# Patient Record
Sex: Male | Born: 1970 | Race: White | Hispanic: No | Smoking: Never smoker
Health system: Southern US, Community
[De-identification: ages and names within clinical notes are randomized; demographics above are authoritative.]

## PROBLEM LIST (undated history)

## (undated) DIAGNOSIS — K5732 Diverticulitis of large intestine without perforation or abscess without bleeding: Secondary | ICD-10-CM

## (undated) DIAGNOSIS — T4145XA Adverse effect of unspecified anesthetic, initial encounter: Secondary | ICD-10-CM

## (undated) DIAGNOSIS — T8859XA Other complications of anesthesia, initial encounter: Secondary | ICD-10-CM

## (undated) HISTORY — PX: NASAL FRACTURE SURGERY: SHX718

## (undated) HISTORY — PX: OTHER SURGICAL HISTORY: SHX169

## (undated) HISTORY — PX: IR FIBRIN GLUE REPAIR ANAL FISTULA: IMG2325

## (undated) HISTORY — PX: HERNIA REPAIR: SHX51

## (undated) HISTORY — DX: Diverticulitis of large intestine without perforation or abscess without bleeding: K57.32

---

## 2012-07-29 ENCOUNTER — Ambulatory Visit (INDEPENDENT_AMBULATORY_CARE_PROVIDER_SITE_OTHER): Payer: BC Managed Care – PPO | Admitting: Urology

## 2012-07-29 DIAGNOSIS — R109 Unspecified abdominal pain: Secondary | ICD-10-CM

## 2012-07-29 DIAGNOSIS — N410 Acute prostatitis: Secondary | ICD-10-CM

## 2017-04-08 ENCOUNTER — Other Ambulatory Visit: Payer: Self-pay | Admitting: Urology

## 2017-04-08 DIAGNOSIS — R109 Unspecified abdominal pain: Secondary | ICD-10-CM

## 2017-04-23 ENCOUNTER — Ambulatory Visit (HOSPITAL_COMMUNITY)
Admission: RE | Admit: 2017-04-23 | Discharge: 2017-04-23 | Disposition: A | Discharge status: Home / Self Care | Payer: BLUE CROSS/BLUE SHIELD | Source: Ambulatory Visit | Attending: Urology | Admitting: Urology

## 2017-04-23 DIAGNOSIS — N321 Vesicointestinal fistula: Secondary | ICD-10-CM | POA: Insufficient documentation

## 2017-04-23 DIAGNOSIS — R109 Unspecified abdominal pain: Secondary | ICD-10-CM | POA: Diagnosis present

## 2017-04-23 DIAGNOSIS — K5732 Diverticulitis of large intestine without perforation or abscess without bleeding: Secondary | ICD-10-CM | POA: Diagnosis not present

## 2017-04-23 MED ORDER — IOPAMIDOL (ISOVUE-300) INJECTION 61%
100.0000 mL | Freq: Once | INTRAVENOUS | Status: AC | PRN
  Administered 2017-04-23: 100 mL via INTRAVENOUS

## 2017-05-02 ENCOUNTER — Ambulatory Visit (INDEPENDENT_AMBULATORY_CARE_PROVIDER_SITE_OTHER): Payer: BLUE CROSS/BLUE SHIELD | Admitting: Internal Medicine

## 2017-05-02 ENCOUNTER — Encounter (INDEPENDENT_AMBULATORY_CARE_PROVIDER_SITE_OTHER): Payer: Self-pay | Admitting: Internal Medicine

## 2017-05-02 ENCOUNTER — Encounter (INDEPENDENT_AMBULATORY_CARE_PROVIDER_SITE_OTHER): Payer: Self-pay | Admitting: *Deleted

## 2017-05-02 ENCOUNTER — Encounter (INDEPENDENT_AMBULATORY_CARE_PROVIDER_SITE_OTHER): Payer: Self-pay

## 2017-05-02 ENCOUNTER — Other Ambulatory Visit (INDEPENDENT_AMBULATORY_CARE_PROVIDER_SITE_OTHER): Payer: Self-pay | Admitting: Internal Medicine

## 2017-05-02 VITALS — BP 150/70 | HR 72 | Temp 98.1°F | Ht 73.0 in | Wt 212.6 lb

## 2017-05-02 DIAGNOSIS — K5732 Diverticulitis of large intestine without perforation or abscess without bleeding: Secondary | ICD-10-CM | POA: Diagnosis not present

## 2017-05-02 HISTORY — DX: Diverticulitis of large intestine without perforation or abscess without bleeding: K57.32

## 2017-05-02 MED ORDER — METRONIDAZOLE 250 MG PO TABS: 250.0000 mg | ORAL_TABLET | Freq: Three times a day (TID) | ORAL | 0 refills | Status: DC

## 2017-05-02 MED ORDER — CIPROFLOXACIN HCL 500 MG PO TABS: 500.0000 mg | ORAL_TABLET | Freq: Two times a day (BID) | ORAL | 0 refills | Status: DC

## 2017-05-02 NOTE — Progress Notes (Signed)
   Subjective:    Patient ID: Brian Maddox, male    DOB: 11/13/1971, 46 y.o.   MRN: [id number] PCP Dr. Neita CarpSasser  HPI Referred by Dr. Joetta Mannersahistedt for sigmoid diverticulitis/colonoscopy.  Saw by Dr. Retta Dionesahlstedt 04/17/2017.  He had symptoms of acute prostatitis.  Symptoms since 03/29/2017.  This has been a recurrent problems. He has fever when he haa a "flare" The pain occurs all over his abdomen  , but more sensitive on the left lower abdomen. He also had pressure when voiding.  He says when he voided he would have gas bubbles in his urine.  He underwent a CT (see below). He says he has been battling this problems for years.  He says he feels great now. Finished the Cipro two days ago. He states he was on Cipro x 30 days. BMs x 5 a day. Some are sold and some are nothing but mucous.  Appetite is good.  He tells me he had surgery by Dr. 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  IMPRESSION: Sigmoid diverticulitis with a colovesical fistula. Suspect an associated sigmoid intussusception. A lead point mass cannot be excluded. Follow-up CT abdomen pelvis with contrast or colonoscopy is recommended after appropriate treatment, to exclude underlying malignancy. Review of Systems Past Medical History:  Diagnosis Date  . Diverticulitis of colon 05/02/2017    Past Surgical History:  Procedure Laterality Date  . HERNIA REPAIR     age 48 months  . IR FIBRIN GLUE REPAIR ANAL FISTULA     last year  . kidney reflux surgery     7318 months old  . NASAL FRACTURE SURGERY     fx nose  . three knee sugeries     two ACL/MCL repair. 3rd was scoped    No Known Allergies  No current outpatient prescriptions on file prior to visit.   No current facility-administered medications on file prior to visit.        Objective:   Physical Exam Blood pressure (!) 150/70, pulse 72, temperature 98.1 F (36.7 C), height 6\' 1"  (1.854 m),  weight 212 lb 9.6 oz (96.4 kg).  Alert and oriented. Skin warm and dry. Oral mucosa is moist.   . Sclera anicteric, conjunctivae is pink. Thyroid not enlarged. No cervical lymphadenopathy. Lungs clear. Heart regular rate and rhythm.  Abdomen is soft. Bowel sounds are positive. No hepatomegaly. No abdominal masses felt. Slight tenderness LLQ.  No edema to lower extremities.        Assessment & Plan:  Sigmoid diverticulitis with colovescial fistula. I discussed this with Dr Karilyn Cotaehman earlier this week Colonoscopy in 2-3 weeks.  Rx for Cipro and Flagyl sent to his pharmacy x 10 days

## 2017-05-02 NOTE — Telephone Encounter (Signed)
This encounter was created in error - please disregard.

## 2017-05-02 NOTE — Patient Instructions (Signed)
Continue the antibiotics Colonoscopy. The risks and benefits such as perforation, bleeding, and infection were reviewed with the patient and is agreeable.

## 2017-05-03 ENCOUNTER — Encounter (INDEPENDENT_AMBULATORY_CARE_PROVIDER_SITE_OTHER): Payer: Self-pay

## 2017-05-22 ENCOUNTER — Encounter (HOSPITAL_COMMUNITY)
Admission: RE | Disposition: A | Discharge status: Home / Self Care | Payer: Self-pay | Source: Ambulatory Visit | Attending: Internal Medicine

## 2017-05-22 ENCOUNTER — Ambulatory Visit (HOSPITAL_COMMUNITY)
Admission: RE | Admit: 2017-05-22 | Discharge: 2017-05-22 | Disposition: A | Discharge status: Home / Self Care | Payer: BLUE CROSS/BLUE SHIELD | Source: Ambulatory Visit | Attending: Internal Medicine | Admitting: Internal Medicine

## 2017-05-22 ENCOUNTER — Encounter (HOSPITAL_COMMUNITY): Payer: Self-pay | Admitting: *Deleted

## 2017-05-22 DIAGNOSIS — R1032 Left lower quadrant pain: Secondary | ICD-10-CM | POA: Diagnosis not present

## 2017-05-22 DIAGNOSIS — Z9889 Other specified postprocedural states: Secondary | ICD-10-CM | POA: Insufficient documentation

## 2017-05-22 DIAGNOSIS — K5730 Diverticulosis of large intestine without perforation or abscess without bleeding: Secondary | ICD-10-CM | POA: Diagnosis not present

## 2017-05-22 DIAGNOSIS — Z8489 Family history of other specified conditions: Secondary | ICD-10-CM | POA: Diagnosis not present

## 2017-05-22 DIAGNOSIS — K5732 Diverticulitis of large intestine without perforation or abscess without bleeding: Secondary | ICD-10-CM | POA: Diagnosis present

## 2017-05-22 DIAGNOSIS — Z807 Family history of other malignant neoplasms of lymphoid, hematopoietic and related tissues: Secondary | ICD-10-CM | POA: Diagnosis not present

## 2017-05-22 DIAGNOSIS — K6389 Other specified diseases of intestine: Secondary | ICD-10-CM | POA: Diagnosis not present

## 2017-05-22 DIAGNOSIS — K633 Ulcer of intestine: Secondary | ICD-10-CM | POA: Diagnosis not present

## 2017-05-22 DIAGNOSIS — Z8249 Family history of ischemic heart disease and other diseases of the circulatory system: Secondary | ICD-10-CM | POA: Insufficient documentation

## 2017-05-22 HISTORY — PX: COLONOSCOPY: SHX5424

## 2017-05-22 SURGERY — COLONOSCOPY
Anesthesia: Moderate Sedation

## 2017-05-22 MED ORDER — MEPERIDINE HCL 50 MG/ML IJ SOLN
INTRAMUSCULAR | Status: DC | PRN
  Administered 2017-05-22 (×2): 25 mg via INTRAVENOUS

## 2017-05-22 MED ORDER — MIDAZOLAM HCL 5 MG/5ML IJ SOLN
INTRAMUSCULAR | Status: DC | PRN
  Administered 2017-05-22: 2 mg via INTRAVENOUS
  Administered 2017-05-22: 3 mg via INTRAVENOUS
  Administered 2017-05-22: 2 mg via INTRAVENOUS
  Administered 2017-05-22: 3 mg via INTRAVENOUS

## 2017-05-22 MED ORDER — STERILE WATER FOR IRRIGATION IR SOLN
Status: DC | PRN
  Administered 2017-05-22: 4 mL

## 2017-05-22 MED ORDER — MIDAZOLAM HCL 5 MG/5ML IJ SOLN
INTRAMUSCULAR | Status: AC
  Filled 2017-05-22: qty 5

## 2017-05-22 MED ORDER — MEPERIDINE HCL 50 MG/ML IJ SOLN
INTRAMUSCULAR | Status: AC
  Filled 2017-05-22: qty 1

## 2017-05-22 MED ORDER — MIDAZOLAM HCL 5 MG/5ML IJ SOLN
INTRAMUSCULAR | Status: AC
  Filled 2017-05-22: qty 10

## 2017-05-22 MED ORDER — SODIUM CHLORIDE 0.9 % IV SOLN
INTRAVENOUS | Status: DC
  Administered 2017-05-22: 1000 mL via INTRAVENOUS

## 2017-05-22 MED ORDER — METRONIDAZOLE 250 MG PO TABS: 250.0000 mg | ORAL_TABLET | Freq: Three times a day (TID) | ORAL | 0 refills | Status: DC

## 2017-05-22 MED ORDER — CIPROFLOXACIN HCL 500 MG PO TABS: 500.0000 mg | ORAL_TABLET | Freq: Two times a day (BID) | ORAL | 0 refills | Status: DC

## 2017-05-22 NOTE — Op Note (Signed)
[facility] Patient Name: Brian Maddox Procedure Date: 05/22/2017 3:41 PM MRN: [id number] Date of Birth: 06/29/1971 Attending MD: Lionel December , MD CSN: [id number] Age: 46 Admit Type: Outpatient Procedure:                Colonoscopy Indications:              Suspected diverticulitis Providers:                Lionel December, MD, Jannett Celestine, RN, [place] Scrape RN, RN Referring MD:             Bertram Millard. Dahlstedt, MD and Estanislado Pandy, MD Medicines:                Meperidine 50 mg IV, Midazolam 10 mg IV Complications:            No immediate complications. Estimated Blood Loss:     Estimated blood loss: none. Procedure:                Pre-Anesthesia Assessment:                           - Prior to the procedure, a History and Physical                            was performed, and patient medications and                            allergies were reviewed. The patient's tolerance of                            previous anesthesia was also reviewed. The risks                            and benefits of the procedure and the sedation                            options and risks were discussed with the patient.                            All questions were answered, and informed consent                            was obtained. Prior Anticoagulants: The patient                            last took previous NSAID medication 7 days prior to                            the procedure. ASA Grade Assessment: I - A normal,                            healthy patient. After reviewing the risks and  benefits, the patient was deemed in satisfactory                            condition to undergo the procedure.                           After obtaining informed consent, the colonoscope                            was passed under direct vision. Throughout the                            procedure, the patient's blood pressure, pulse, and                           oxygen saturations were monitored continuously. The                            EC-[id number] ([id number] scope was introduced through                            the anus and advanced to the the terminal ileum,                            with identification of the appendiceal orifice and                            IC valve. The colonoscopy was performed without                            difficulty. The patient tolerated the procedure                            well. The quality of the bowel preparation was                            adequate. The terminal ileum, ileocecal valve,                            appendiceal orifice, and rectum were photographed. Scope In: 4:11:52 PM Scope Out: 4:29:01 PM Scope Withdrawal Time: 0 hours 11 minutes 9 seconds  Total Procedure Duration: 0 hours 17 minutes 9 seconds  Findings:      The perianal and digital rectal examinations were normal.      The terminal ileum appeared normal.      The descending colon, splenic flexure, transverse colon, hepatic       flexure, ascending colon, cecum, appendiceal orifice and ileocecal valve       appeared normal.      Scattered small-mouthed diverticula were found in the sigmoid colon.      Exudate was found in the sigmoid colon.      Ulcerated mucosa at mid sigmoid colon covered with exudate as well as       submucosal hemorrhages.      The retroflexed view of the distal rectum and anal verge was normal and  showed no anal or rectal abnormalities. Impression:               - The examined portion of the ileum was normal.                           - The descending colon, splenic flexure, transverse                            colon, hepatic flexure, ascending colon, cecum,                            appendiceal orifice and ileocecal valve are normal.                           - Diverticulosis in the sigmoid colon.                           - Endoscopic findings consistent with sigmoid                             diverticulitis.                           - No specimens collected.                           Comment: Diverticulitis not resolved with                            antibiotics. Patient will need surgical                            intervention. Moderate Sedation:      Moderate (conscious) sedation was administered by the endoscopy nurse       and supervised by the endoscopist. The following parameters were       monitored: oxygen saturation, heart rate, blood pressure, CO2       capnography and response to care. Total physician intraservice time was       25 minutes. Recommendation:           - Patient has a contact number available for                            emergencies. The signs and symptoms of potential                            delayed complications were discussed with the                            patient. Return to normal activities tomorrow.                            Written discharge instructions were provided to the                            patient.                           -  Resume previous diet today.                           - patient will resume Cipro at 500 mg by mouth                            twice a day and metronidazole 250 mg by mouth 3                            times a day.                           -Will arrange for follow-up with Dr. Retta Diones.                           - No aspirin, ibuprofen, naproxen, or other                            non-steroidal anti-inflammatory drugs.                           - Repeat colonoscopy in 10 years for screening                            purposes. Procedure Code(s):        --- Professional ---                           [phone], Colonoscopy, flexible; diagnostic, including                            collection of specimen(s) by brushing or washing,                            when performed (separate procedure)                           99152, Moderate sedation services provided by the                             same physician or other qualified health care                            professional performing the diagnostic or                            therapeutic service that the sedation supports,                            requiring the presence of an independent trained                            observer to assist in the monitoring of the  patient's level of consciousness and physiological                            status; initial 15 minutes of intraservice time,                            patient age 47 years or older                           [phone], Moderate sedation services; each additional                            15 minutes intraservice time Diagnosis Code(s):        --- Professional ---                           K63.89, Other specified diseases of intestine                           K63.3, Ulcer of intestine                           K57.30, Diverticulosis of large intestine without                            perforation or abscess without bleeding CPT copyright 2016 American Medical Association. All rights reserved. The codes documented in this report are preliminary and upon coder review may  be revised to meet current compliance requirements. Lionel December, MD Lionel December, MD 05/22/2017 4:47:25 PM This report has been signed electronically. Number of Addenda: 0

## 2017-05-22 NOTE — Discharge Instructions (Signed)
Resume Cipro and metronidazole as before. Prescription sent to pharmacy for 2 more weeks. Resume usual diet. Follow-up with Dr. Retta Dionesahlstedt. I will contact his office and arrange for visit.      Colonoscopy, Adult, Care After This sheet gives you information about how to care for yourself after your procedure. Your doctor may also give you more specific instructions. If you have problems or questions, call your doctor. Follow these instructions at home: General instructions   For the first 24 hours after the procedure: ? Do not drive or use machinery. ? Do not sign important documents. ? Do not drink alcohol. ? Do your daily activities more slowly than normal. ? Eat foods that are soft and easy to digest. ? Rest often.  Take over-the-counter or prescription medicines only as told by your doctor.  It is up to you to get the results of your procedure. Ask your doctor, or the department performing the procedure, when your results will be ready. To help cramping and bloating:  Try walking around.  Put heat on your belly (abdomen) as told by your doctor. Use a heat source that your doctor recommends, such as a moist heat pack or a heating pad. ? Put a towel between your skin and the heat source. ? Leave the heat on for 20-30 minutes. ? Remove the heat if your skin turns bright red. This is especially important if you cannot feel pain, heat, or cold. You can get burned. Eating and drinking  Drink enough fluid to keep your pee (urine) clear or pale yellow.  Return to your normal diet as told by your doctor. Avoid heavy or fried foods that are hard to digest.  Avoid drinking alcohol for as long as told by your doctor. Contact a doctor if:  You have blood in your poop (stool) 2-3 days after the procedure. Get help right away if:  You have more than a small amount of blood in your poop.  You see large clumps of tissue (blood clots) in your poop.  Your belly is swollen.  You  feel sick to your stomach (nauseous).  You throw up (vomit).  You have a fever.  You have belly pain that gets worse, and medicine does not help your pain. This information is not intended to replace advice given to you by your health care provider. Make sure you discuss any questions you have with your health care provider. Document Released: 12/29/2010 Document Revised: 08/20/2016 Document Reviewed: 08/20/2016 Elsevier Interactive Patient Education  2017 Elsevier Inc.    Diverticulitis Diverticulitis is when small pockets in your large intestine (colon) get infected or swollen. This causes stomach pain and watery poop (diarrhea). These pouches are called diverticula. They form in people who have a condition called diverticulosis. Follow these instructions at home: Medicines  Take over-the-counter and prescription medicines only as told by your doctor. These include: ? Antibiotics. ? Pain medicines. ? Fiber pills. ? Probiotics. ? Stool softeners.  Do not drive or use heavy machinery while taking prescription pain medicine.  If you were prescribed an antibiotic, take it as told. Do not stop taking it even if you feel better. General instructions  Follow a diet as told by your doctor.  When you feel better, your doctor may tell you to change your diet. You may need to eat a lot of fiber. Fiber makes it easier to poop (have bowel movements). Healthy foods with fiber include: ? Berries. ? Beans. ? Lentils. ? Green vegetables.  Exercise  3 or more times a week. Aim for 30 minutes each time. Exercise enough to sweat and make your heart beat faster.  Keep all follow-up visits as told. This is important. You may need to have an exam of the large intestine. This is called a colonoscopy. Contact a doctor if:  Your pain does not get better.  You have a hard time eating or drinking.  You are not pooping like normal. Get help right away if:  Your pain gets worse.  Your  problems do not get better.  Your problems get worse very fast.  You have a fever.  You throw up (vomit) more than one time.  You have poop that is: ? Bloody. ? Black. ? Tarry. Summary  Diverticulitis is when small pockets in your large intestine (colon) get infected or swollen.  Take medicines only as told by your doctor.  Follow a diet as told by your doctor. This information is not intended to replace advice given to you by your health care provider. Make sure you discuss any questions you have with your health care provider. Document Released: 05/14/2008 Document Revised: 12/13/2016 Document Reviewed: 12/13/2016 Elsevier Interactive Patient Education  2017 [employer].

## 2017-05-22 NOTE — H&P (Signed)
Brian Maddox is an 46 y.o. male.   Chief Complaint: Patient is here for colonoscopy. HPI: Patient is 46 year old Caucasian male who was recently diagnosed with diverticulitis complicated by colovesical fistula. he's been treated with antibiotics and not passing air when he urinates. He still has mild discomfort at LLQ. He has sporadic hematochezia in the form of blood on the tissue. She has good appetite and he has not lost any weight. He takes ibuprofen up to 800 mg once or twice a week on as-needed basis. Family studies negative for CRC or IBD but father has been treated for diverticulitis.   Past Medical History:  Diagnosis Date  . Diverticulitis of colon 05/02/2017    Past Surgical History:  Procedure Laterality Date  . HERNIA REPAIR     age 48 months  . IR FIBRIN GLUE REPAIR ANAL FISTULA     last year  . kidney reflux surgery     118 months old  . NASAL FRACTURE SURGERY     fx nose  . three knee sugeries     two ACL/MCL repair. 3rd was scoped    Family History  Problem Relation Age of Onset  . Non-Hodgkin's lymphoma Mother   . Sarcoidosis Father   . Coronary artery disease Father    Social History:  reports that he has never smoked. He has never used smokeless tobacco. He reports that he drinks alcohol. He reports that he does not use drugs.  Allergies: No Known Allergies  Medications Prior to Admission  Medication Sig Dispense Refill  . ciprofloxacin (CIPRO) 500 MG tablet Take 1 tablet (500 mg total) by mouth 2 (two) times daily. (Patient not taking: Reported on 05/20/2017) 28 tablet 0  . metroNIDAZOLE (FLAGYL) 250 MG tablet Take 1 tablet (250 mg total) by mouth 3 (three) times daily. (Patient not taking: Reported on 05/20/2017) 30 tablet 0    No results found for this or any previous visit (from the past 48 hour(s)). No results found.  ROS  Blood pressure 124/73, pulse 76, temperature 97.7 F (36.5 C), temperature source Oral, resp. rate 13, height 6\' 1"  (1.854  m), weight 205 lb (93 kg), SpO2 100 %. Physical Exam  Constitutional: He appears well-developed and well-nourished.  HENT:  Mouth/Throat: Oropharynx is clear and moist.  Eyes: Conjunctivae are normal. No scleral icterus.  Neck: No thyromegaly present.  Cardiovascular: Normal rate, regular rhythm and normal heart sounds.   No murmur heard. Respiratory: Effort normal and breath sounds normal.  GI:  Abdomen is symmetrical and soft with mild tenderness at LLQ. No organomegaly or masses.  Musculoskeletal: He exhibits no edema.  Lymphadenopathy:    He has no cervical adenopathy.  Neurological: He is alert.  Skin: Skin is warm and dry.     Assessment/Plan History of diverticulitis with caudal was acting fistula. Fistula has possibly healed. Diagnostic colonoscopy.  Brian DecemberNajeeb Britt Theard, MD 05/22/2017, 3:55 PM

## 2017-05-27 ENCOUNTER — Encounter (HOSPITAL_COMMUNITY): Payer: Self-pay | Admitting: Internal Medicine

## 2017-05-28 ENCOUNTER — Telehealth (INDEPENDENT_AMBULATORY_CARE_PROVIDER_SITE_OTHER): Payer: Self-pay | Admitting: Internal Medicine

## 2017-05-28 NOTE — Telephone Encounter (Signed)
Patient called, he would like to know if Dr. Karilyn Cotaehman has contacted Dr. Retta Dionesahlstedt.  He wants to find out where we are going from here regarding his case.  [phone]

## 2017-05-28 NOTE — Telephone Encounter (Signed)
Forwarded to Dr.Rehman for review. 

## 2017-05-29 NOTE — Telephone Encounter (Signed)
Dr.Rehman has talked with Dr.Dahlstedt and he, Dr.Dahlstedt is going to make arrangements for patient's surgery. A message has been left on the patient's voicemail.

## 2017-06-20 ENCOUNTER — Ambulatory Visit (HOSPITAL_COMMUNITY): Payer: Self-pay | Admitting: Surgery

## 2017-07-26 NOTE — Patient Instructions (Addendum)
Brian Maddox  07/26/2017   Your procedure is scheduled on: 08-07-17   Report to [facility] Main  Entrance Take [place] Elevators to 3rd floor to  Short Stay Center at 8:00 AM.   Call this number if you have problems the morning of surgery [phone]    Remember: ONLY 1 PERSON MAY GO WITH YOU TO SHORT STAY TO GET  READY MORNING OF YOUR SURGERY.  Please consume a Clear Liquid Diet on the day of prep to prevent dehydration. Do not eat food or drink liquids :After Midnight.     CLEAR LIQUID DIET   Foods Allowed                                                                     Foods Excluded  Coffee and tea, regular and decaf                             liquids that you cannot  Plain Jell-O in any flavor                                             see through such as: Fruit ices (not with fruit pulp)                                     milk, soups, orange juice  Iced Popsicles                                    All solid food Carbonated beverages, regular and diet                                    Cranberry, grape and apple juices Sports drinks like Gatorade Lightly seasoned clear broth or consume(fat free) Sugar, honey syrup  Sample Menu Breakfast                                Lunch                                     Supper Cranberry juice                    Beef broth                            Chicken broth Jell-O                                     Grape juice  Apple juice Coffee or tea                        Jell-O                                      Popsicle                                                Coffee or tea                        Coffee or tea  _____________________________________________________________________     Take these medicines the morning of surgery with A SIP OF WATER: None                                You may not have any metal on your body including hair pins and              piercings   Do not wear jewelry, make-up, lotions, powders or perfumes, deodorant             Men may shave face and neck.   Do not bring valuables to the hospital. [facility] IS NOT             RESPONSIBLE   FOR VALUABLES.  Contacts, dentures or bridgework may not be worn into surgery.  Leave suitcase in the car. After surgery it may be brought to your room.                 Please read over the following fact sheets you were given: _____________________________________________________________________             [facility] - Preparing for Surgery Before surgery, you can play an important role.  Because skin is not sterile, your skin needs to be as free of germs as possible.  You can reduce the number of germs on your skin by washing with CHG (chlorahexidine gluconate) soap before surgery.  CHG is an antiseptic cleaner which kills germs and bonds with the skin to continue killing germs even after washing. Please DO NOT use if you have an allergy to CHG or antibacterial soaps.  If your skin becomes reddened/irritated stop using the CHG and inform your nurse when you arrive at Short Stay. Do not shave (including legs and underarms) for at least 48 hours prior to the first CHG shower.  You may shave your face/neck. Please follow these instructions carefully:  1.  Shower with CHG Soap the night before surgery and the  morning of Surgery.  2.  If you choose to wash your hair, wash your hair first as usual with your  normal  shampoo.  3.  After you shampoo, rinse your hair and body thoroughly to remove the  shampoo.                           4.  Use CHG as you would any other liquid soap.  You can apply chg directly  to the skin and wash  Gently with a scrungie or clean washcloth.  5.  Apply the CHG Soap to your body ONLY FROM THE NECK DOWN.   Do not use on face/ open                           Wound or open sores. Avoid contact with eyes, ears mouth and genitals (private parts).                        Wash face,  Genitals (private parts) with your normal soap.             6.  Wash thoroughly, paying special attention to the area where your surgery  will be performed.  7.  Thoroughly rinse your body with warm water from the neck down.  8.  DO NOT shower/wash with your normal soap after using and rinsing off  the CHG Soap.                9.  Pat yourself dry with a clean towel.            10.  Wear clean pajamas.            11.  Place clean sheets on your bed the night of your first shower and do not  sleep with pets. Day of Surgery : Do not apply any lotions/deodorants the morning of surgery.  Please wear clean clothes to the hospital/surgery center.  FAILURE TO FOLLOW THESE INSTRUCTIONS MAY RESULT IN THE CANCELLATION OF YOUR SURGERY PATIENT SIGNATURE_________________________________  NURSE SIGNATURE__________________________________  ________________________________________________________________________

## 2017-07-31 ENCOUNTER — Encounter (HOSPITAL_COMMUNITY): Payer: Self-pay

## 2017-07-31 ENCOUNTER — Encounter (HOSPITAL_COMMUNITY)
Admission: RE | Admit: 2017-07-31 | Discharge: 2017-07-31 | Disposition: A | Discharge status: Home / Self Care | Payer: BLUE CROSS/BLUE SHIELD | Source: Ambulatory Visit | Attending: Surgery | Admitting: Surgery

## 2017-07-31 ENCOUNTER — Encounter (INDEPENDENT_AMBULATORY_CARE_PROVIDER_SITE_OTHER): Payer: Self-pay

## 2017-07-31 DIAGNOSIS — K5732 Diverticulitis of large intestine without perforation or abscess without bleeding: Secondary | ICD-10-CM | POA: Insufficient documentation

## 2017-07-31 DIAGNOSIS — K632 Fistula of intestine: Secondary | ICD-10-CM | POA: Insufficient documentation

## 2017-07-31 DIAGNOSIS — Z01812 Encounter for preprocedural laboratory examination: Secondary | ICD-10-CM | POA: Diagnosis present

## 2017-07-31 HISTORY — DX: Other complications of anesthesia, initial encounter: T88.59XA

## 2017-07-31 HISTORY — DX: Adverse effect of unspecified anesthetic, initial encounter: T41.45XA

## 2017-07-31 LAB — CBC
HCT: 43.6 % (ref 39.0–52.0)
HEMOGLOBIN: 14.4 g/dL (ref 13.0–17.0)
MCH: 27.9 pg (ref 26.0–34.0)
MCHC: 33 g/dL (ref 30.0–36.0)
MCV: 84.3 fL (ref 78.0–100.0)
Platelets: 235 10*3/uL (ref 150–400)
RBC: 5.17 MIL/uL (ref 4.22–5.81)
RDW: 14.4 % (ref 11.5–15.5)
WBC: 11.5 10*3/uL — ABNORMAL HIGH (ref 4.0–10.5)

## 2017-07-31 LAB — HEMOGLOBIN A1C
Hgb A1c MFr Bld: 5.8 % — ABNORMAL HIGH (ref 4.8–5.6)
Mean Plasma Glucose: 119.76 mg/dL

## 2017-07-31 LAB — ABO/RH: ABO/RH(D): O POS

## 2017-07-31 NOTE — Consult Note (Signed)
WOC Nurse requested for preoperative stoma site marking  Discussed surgical procedure and stoma creation with patient. Explained role of the WOC nurse team.  Provided the patient with educational booklet and provided samples of pouching options.  Answered patient questions. He is very much hoping to not have a stoma. We discussed the rationale for marking "just in case" so that it would in a good location for him to manage.  Examined patient sitting, and standing in order to place the marking in the patient's visual field, away from any creases or abdominal contour issues and within the rectus muscle.  Not able to mark below the patient's belt line due to the fact he wears his pants low on his abdomen.  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    Patient's abdomen cleansed with CHG wipes at site markings, allowed to air dry prior to marking.Covered mark with thin film transparent dressing to preserve mark until date of surgery. Requested that the patient reapply marking if it becomes light or the thin film comes off. He is a [employer] and is outside a lot, concerned that with diaphoresis the thin film may detach. He has the surgical marking pen with him.   WOC Nurse team will follow up with patient after surgery for continue ostomy care and teaching if needed.  Numa Heatwole [facility] MSN, RN,CWOCN, CNS, [employer] [phone]

## 2017-08-06 MED ORDER — SODIUM CHLORIDE 0.9 % IV SOLN
INTRAVENOUS | Status: DC
  Filled 2017-08-06: qty 6

## 2017-08-07 ENCOUNTER — Encounter (HOSPITAL_COMMUNITY): Payer: Self-pay | Admitting: *Deleted

## 2017-08-07 ENCOUNTER — Encounter (HOSPITAL_COMMUNITY)
Admission: RE | Disposition: A | Discharge status: Home / Self Care | Payer: Self-pay | Source: Ambulatory Visit | Attending: Surgery

## 2017-08-07 ENCOUNTER — Inpatient Hospital Stay (HOSPITAL_COMMUNITY)
Admission: RE | Admit: 2017-08-07 | Discharge: 2017-08-09 | DRG: 330 | Disposition: A | Discharge status: Home / Self Care | Payer: BLUE CROSS/BLUE SHIELD | Source: Ambulatory Visit | Attending: Surgery | Admitting: Surgery

## 2017-08-07 ENCOUNTER — Inpatient Hospital Stay (HOSPITAL_COMMUNITY): Payer: BLUE CROSS/BLUE SHIELD | Admitting: Anesthesiology

## 2017-08-07 DIAGNOSIS — K5732 Diverticulitis of large intestine without perforation or abscess without bleeding: Secondary | ICD-10-CM | POA: Diagnosis present

## 2017-08-07 DIAGNOSIS — N321 Vesicointestinal fistula: Secondary | ICD-10-CM

## 2017-08-07 DIAGNOSIS — N400 Enlarged prostate without lower urinary tract symptoms: Secondary | ICD-10-CM | POA: Diagnosis present

## 2017-08-07 DIAGNOSIS — K632 Fistula of intestine: Secondary | ICD-10-CM | POA: Diagnosis present

## 2017-08-07 HISTORY — PX: PROCTOSCOPY: SHX2266

## 2017-08-07 LAB — TYPE AND SCREEN
ABO/RH(D): O POS
Antibody Screen: NEGATIVE

## 2017-08-07 SURGERY — COLECTOMY, PARTIAL, ROBOT-ASSISTED, LAPAROSCOPIC
Anesthesia: General

## 2017-08-07 MED ORDER — SUGAMMADEX SODIUM 200 MG/2ML IV SOLN
INTRAVENOUS | Status: DC | PRN
  Administered 2017-08-07: 200 mg via INTRAVENOUS

## 2017-08-07 MED ORDER — SODIUM CHLORIDE 0.9 % IR SOLN
Status: DC | PRN
  Administered 2017-08-07: 1000 mL via INTRAVESICAL

## 2017-08-07 MED ORDER — 0.9 % SODIUM CHLORIDE (POUR BTL) OPTIME
TOPICAL | Status: DC | PRN
  Administered 2017-08-07: 2000 mL

## 2017-08-07 MED ORDER — ACETAMINOPHEN 500 MG PO TABS
1000.0000 mg | ORAL_TABLET | ORAL | Status: AC
  Administered 2017-08-07: 1000 mg via ORAL
  Filled 2017-08-07: qty 2

## 2017-08-07 MED ORDER — METRONIDAZOLE 500 MG PO TABS: 1000.0000 mg | ORAL_TABLET | ORAL | Status: DC

## 2017-08-07 MED ORDER — HYDROCORTISONE 2.5 % RE CREA
1.0000 "application " | TOPICAL_CREAM | Freq: Four times a day (QID) | RECTAL | Status: DC | PRN
  Filled 2017-08-07 (×2): qty 28.35

## 2017-08-07 MED ORDER — FENTANYL CITRATE (PF) 250 MCG/5ML IJ SOLN
INTRAMUSCULAR | Status: AC
  Filled 2017-08-07: qty 5

## 2017-08-07 MED ORDER — ALUM & MAG HYDROXIDE-SIMETH 200-200-20 MG/5ML PO SUSP: 30.0000 mL | Freq: Four times a day (QID) | ORAL | Status: DC | PRN

## 2017-08-07 MED ORDER — HYDROMORPHONE HCL-NACL 0.5-0.9 MG/ML-% IV SOSY
0.5000 mg | PREFILLED_SYRINGE | INTRAVENOUS | Status: DC | PRN
  Administered 2017-08-07 – 2017-08-08 (×2): 1 mg via INTRAVENOUS
  Administered 2017-08-08: 2 mg via INTRAVENOUS
  Administered 2017-08-08: 1 mg via INTRAVENOUS
  Administered 2017-08-08: 2 mg via INTRAVENOUS
  Filled 2017-08-07 (×2): qty 2
  Filled 2017-08-07 (×2): qty 4
  Filled 2017-08-07: qty 2

## 2017-08-07 MED ORDER — MAGIC MOUTHWASH
15.0000 mL | Freq: Four times a day (QID) | ORAL | Status: DC | PRN
  Filled 2017-08-07: qty 15

## 2017-08-07 MED ORDER — DIPHENHYDRAMINE HCL 50 MG/ML IJ SOLN: 12.5000 mg | Freq: Four times a day (QID) | INTRAMUSCULAR | Status: DC | PRN

## 2017-08-07 MED ORDER — ROCURONIUM BROMIDE 50 MG/5ML IV SOSY
PREFILLED_SYRINGE | INTRAVENOUS | Status: AC
  Filled 2017-08-07: qty 5

## 2017-08-07 MED ORDER — FENTANYL CITRATE (PF) 100 MCG/2ML IJ SOLN
INTRAMUSCULAR | Status: DC | PRN
Start: 2017-08-07 — End: 2017-08-07
  Administered 2017-08-07 (×3): 50 ug via INTRAVENOUS
  Administered 2017-08-07: 100 ug via INTRAVENOUS

## 2017-08-07 MED ORDER — BUPIVACAINE-EPINEPHRINE (PF) 0.25% -1:200000 IJ SOLN
INTRAMUSCULAR | Status: DC | PRN
  Administered 2017-08-07: 50 mL

## 2017-08-07 MED ORDER — PHENOL 1.4 % MT LIQD: 1.0000 | OROMUCOSAL | Status: DC | PRN

## 2017-08-07 MED ORDER — OXYCODONE HCL 5 MG/5ML PO SOLN: 5.0000 mg | Freq: Once | ORAL | Status: DC | PRN

## 2017-08-07 MED ORDER — POLYETHYLENE GLYCOL 3350 17 GM/SCOOP PO POWD: 1.0000 | Freq: Once | ORAL | Status: DC

## 2017-08-07 MED ORDER — FENTANYL CITRATE (PF) 100 MCG/2ML IJ SOLN
INTRAMUSCULAR | Status: AC
  Filled 2017-08-07: qty 2

## 2017-08-07 MED ORDER — DEXAMETHASONE SODIUM PHOSPHATE 10 MG/ML IJ SOLN
INTRAMUSCULAR | Status: AC
  Filled 2017-08-07: qty 1

## 2017-08-07 MED ORDER — BUPIVACAINE LIPOSOME 1.3 % IJ SUSP
INTRAMUSCULAR | Status: DC | PRN
  Administered 2017-08-07: 20 mL

## 2017-08-07 MED ORDER — NEOMYCIN SULFATE 500 MG PO TABS: 1000.0000 mg | ORAL_TABLET | ORAL | Status: DC

## 2017-08-07 MED ORDER — ALVIMOPAN 12 MG PO CAPS
12.0000 mg | ORAL_CAPSULE | Freq: Once | ORAL | Status: AC
  Administered 2017-08-07: 12 mg via ORAL
  Filled 2017-08-07: qty 1

## 2017-08-07 MED ORDER — SODIUM CHLORIDE 0.9 % IV SOLN
INTRAVENOUS | Status: DC | PRN
  Administered 2017-08-07: 1000 mL

## 2017-08-07 MED ORDER — ACETAMINOPHEN 500 MG PO TABS
1000.0000 mg | ORAL_TABLET | Freq: Three times a day (TID) | ORAL | Status: DC
  Administered 2017-08-07 – 2017-08-09 (×6): 1000 mg via ORAL
  Filled 2017-08-07 (×6): qty 2

## 2017-08-07 MED ORDER — METHYLENE BLUE 0.5 % INJ SOLN
INTRAVENOUS | Status: AC
  Filled 2017-08-07: qty 10

## 2017-08-07 MED ORDER — ENOXAPARIN SODIUM 40 MG/0.4ML ~~LOC~~ SOLN
40.0000 mg | Freq: Once | SUBCUTANEOUS | Status: AC
  Administered 2017-08-07: 40 mg via SUBCUTANEOUS
  Filled 2017-08-07: qty 0.4

## 2017-08-07 MED ORDER — TRAMADOL HCL 50 MG PO TABS: 50.0000 mg | ORAL_TABLET | Freq: Four times a day (QID) | ORAL | 0 refills | Status: DC | PRN

## 2017-08-07 MED ORDER — BUPIVACAINE-EPINEPHRINE (PF) 0.25% -1:200000 IJ SOLN
INTRAMUSCULAR | Status: AC
  Filled 2017-08-07: qty 60

## 2017-08-07 MED ORDER — LIDOCAINE 2% (20 MG/ML) 5 ML SYRINGE
INTRAMUSCULAR | Status: AC
  Filled 2017-08-07: qty 5

## 2017-08-07 MED ORDER — ENOXAPARIN SODIUM 40 MG/0.4ML ~~LOC~~ SOLN
40.0000 mg | SUBCUTANEOUS | Status: DC
  Administered 2017-08-08 – 2017-08-09 (×2): 40 mg via SUBCUTANEOUS
  Filled 2017-08-07 (×2): qty 0.4

## 2017-08-07 MED ORDER — ONDANSETRON HCL 4 MG/2ML IJ SOLN: 4.0000 mg | Freq: Four times a day (QID) | INTRAMUSCULAR | Status: DC | PRN

## 2017-08-07 MED ORDER — ONDANSETRON HCL 4 MG/2ML IJ SOLN
INTRAMUSCULAR | Status: DC | PRN
  Administered 2017-08-07: 4 mg via INTRAVENOUS

## 2017-08-07 MED ORDER — DEXAMETHASONE SODIUM PHOSPHATE 10 MG/ML IJ SOLN
INTRAMUSCULAR | Status: DC | PRN
  Administered 2017-08-07: 10 mg via INTRAVENOUS

## 2017-08-07 MED ORDER — GABAPENTIN 300 MG PO CAPS
300.0000 mg | ORAL_CAPSULE | Freq: Two times a day (BID) | ORAL | Status: DC
  Administered 2017-08-07 – 2017-08-09 (×5): 300 mg via ORAL
  Filled 2017-08-07 (×5): qty 1

## 2017-08-07 MED ORDER — LIDOCAINE 2% (20 MG/ML) 5 ML SYRINGE
INTRAMUSCULAR | Status: DC | PRN
  Administered 2017-08-07: 1.5 mg/kg/h via INTRAVENOUS

## 2017-08-07 MED ORDER — GABAPENTIN 300 MG PO CAPS
300.0000 mg | ORAL_CAPSULE | ORAL | Status: AC
  Administered 2017-08-07: 300 mg via ORAL
  Filled 2017-08-07: qty 1

## 2017-08-07 MED ORDER — LACTATED RINGERS IV SOLN
INTRAVENOUS | Status: DC
  Administered 2017-08-07 (×2): via INTRAVENOUS

## 2017-08-07 MED ORDER — ONDANSETRON HCL 4 MG/2ML IJ SOLN
INTRAMUSCULAR | Status: AC
  Filled 2017-08-07: qty 2

## 2017-08-07 MED ORDER — ROCURONIUM BROMIDE 100 MG/10ML IV SOLN
INTRAVENOUS | Status: DC | PRN
  Administered 2017-08-07: 50 mg via INTRAVENOUS
  Administered 2017-08-07 (×4): 20 mg via INTRAVENOUS

## 2017-08-07 MED ORDER — BISACODYL 5 MG PO TBEC: 20.0000 mg | DELAYED_RELEASE_TABLET | Freq: Once | ORAL | Status: DC

## 2017-08-07 MED ORDER — DIPHENHYDRAMINE HCL 12.5 MG/5ML PO ELIX: 12.5000 mg | ORAL_SOLUTION | Freq: Four times a day (QID) | ORAL | Status: DC | PRN

## 2017-08-07 MED ORDER — MENTHOL 3 MG MT LOZG: 1.0000 | LOZENGE | OROMUCOSAL | Status: DC | PRN

## 2017-08-07 MED ORDER — MEPERIDINE HCL 50 MG/ML IJ SOLN
12.5000 mg | Freq: Once | INTRAMUSCULAR | Status: AC
  Administered 2017-08-07: 12.5 mg via INTRAVENOUS

## 2017-08-07 MED ORDER — ZOLPIDEM TARTRATE 5 MG PO TABS: 5.0000 mg | ORAL_TABLET | Freq: Every evening | ORAL | Status: DC | PRN

## 2017-08-07 MED ORDER — DEXTROSE 5 % IV SOLN
2.0000 g | Freq: Two times a day (BID) | INTRAVENOUS | Status: AC
  Administered 2017-08-07: 2 g via INTRAVENOUS
  Filled 2017-08-07: qty 2

## 2017-08-07 MED ORDER — LIDOCAINE 2% (20 MG/ML) 5 ML SYRINGE
INTRAMUSCULAR | Status: AC
  Filled 2017-08-07: qty 10

## 2017-08-07 MED ORDER — CELECOXIB 200 MG PO CAPS
400.0000 mg | ORAL_CAPSULE | ORAL | Status: AC
  Administered 2017-08-07: 400 mg via ORAL
  Filled 2017-08-07: qty 2

## 2017-08-07 MED ORDER — MIDAZOLAM HCL 2 MG/2ML IJ SOLN
INTRAMUSCULAR | Status: AC
  Filled 2017-08-07: qty 2

## 2017-08-07 MED ORDER — LACTATED RINGERS IV SOLN
1000.0000 mL | Freq: Three times a day (TID) | INTRAVENOUS | Status: DC | PRN
Start: 2017-08-07 — End: 2017-08-09

## 2017-08-07 MED ORDER — ROCURONIUM BROMIDE 50 MG/5ML IV SOSY
PREFILLED_SYRINGE | INTRAVENOUS | Status: AC
Start: 2017-08-07 — End: 2017-08-07
  Filled 2017-08-07: qty 5

## 2017-08-07 MED ORDER — PROCHLORPERAZINE EDISYLATE 5 MG/ML IJ SOLN: 5.0000 mg | INTRAMUSCULAR | Status: DC | PRN

## 2017-08-07 MED ORDER — SODIUM CHLORIDE 0.9 % IV SOLN
INTRAVENOUS | Status: DC
  Administered 2017-08-07: 20:00:00 via INTRAVENOUS

## 2017-08-07 MED ORDER — LIP MEDEX EX OINT
1.0000 "application " | TOPICAL_OINTMENT | Freq: Two times a day (BID) | CUTANEOUS | Status: DC
  Administered 2017-08-07 – 2017-08-09 (×5): 1 via TOPICAL
  Filled 2017-08-07 (×2): qty 7

## 2017-08-07 MED ORDER — METHYLENE BLUE 0.5 % INJ SOLN
INTRAVENOUS | Status: DC | PRN
  Administered 2017-08-07: 3 mL

## 2017-08-07 MED ORDER — OXYCODONE HCL 5 MG PO TABS: 5.0000 mg | ORAL_TABLET | Freq: Once | ORAL | Status: DC | PRN

## 2017-08-07 MED ORDER — SUGAMMADEX SODIUM 200 MG/2ML IV SOLN
INTRAVENOUS | Status: AC
  Filled 2017-08-07: qty 2

## 2017-08-07 MED ORDER — PROMETHAZINE HCL 25 MG/ML IJ SOLN: 6.2500 mg | INTRAMUSCULAR | Status: DC | PRN

## 2017-08-07 MED ORDER — HYDROCORTISONE 1 % EX CREA
1.0000 | TOPICAL_CREAM | Freq: Three times a day (TID) | CUTANEOUS | Status: DC | PRN
Start: 2017-08-07 — End: 2017-08-09

## 2017-08-07 MED ORDER — MIDAZOLAM HCL 5 MG/5ML IJ SOLN
INTRAMUSCULAR | Status: DC | PRN
  Administered 2017-08-07: 2 mg via INTRAVENOUS

## 2017-08-07 MED ORDER — PROPOFOL 10 MG/ML IV BOLUS
INTRAVENOUS | Status: AC
  Filled 2017-08-07: qty 20

## 2017-08-07 MED ORDER — GUAIFENESIN-DM 100-10 MG/5ML PO SYRP: 10.0000 mL | ORAL_SOLUTION | ORAL | Status: DC | PRN

## 2017-08-07 MED ORDER — HYDROMORPHONE HCL-NACL 0.5-0.9 MG/ML-% IV SOSY: 0.2500 mg | PREFILLED_SYRINGE | INTRAVENOUS | Status: DC | PRN

## 2017-08-07 MED ORDER — ALVIMOPAN 12 MG PO CAPS
12.0000 mg | ORAL_CAPSULE | Freq: Two times a day (BID) | ORAL | Status: DC
  Administered 2017-08-08: 12 mg via ORAL
  Filled 2017-08-07 (×2): qty 1

## 2017-08-07 MED ORDER — METOPROLOL TARTRATE 5 MG/5ML IV SOLN: 5.0000 mg | Freq: Four times a day (QID) | INTRAVENOUS | Status: DC | PRN

## 2017-08-07 MED ORDER — BUPIVACAINE LIPOSOME 1.3 % IJ SUSP
20.0000 mL | INTRAMUSCULAR | Status: DC
  Filled 2017-08-07: qty 20

## 2017-08-07 MED ORDER — PROPOFOL 10 MG/ML IV BOLUS
INTRAVENOUS | Status: DC | PRN
  Administered 2017-08-07: 200 mg via INTRAVENOUS

## 2017-08-07 MED ORDER — CEFOTETAN DISODIUM-DEXTROSE 2-2.08 GM-% IV SOLR
2.0000 g | INTRAVENOUS | Status: AC
  Administered 2017-08-07: 2 g via INTRAVENOUS
  Filled 2017-08-07: qty 50

## 2017-08-07 MED ORDER — ENSURE SURGERY PO LIQD
237.0000 mL | Freq: Two times a day (BID) | ORAL | Status: DC
  Administered 2017-08-07 – 2017-08-08 (×3): 237 mL via ORAL
  Filled 2017-08-07 (×5): qty 237

## 2017-08-07 MED ORDER — MEPERIDINE HCL 50 MG/ML IJ SOLN
INTRAMUSCULAR | Status: AC
Start: 2017-08-07 — End: 2017-08-08
  Filled 2017-08-07: qty 1

## 2017-08-07 MED ORDER — SACCHAROMYCES BOULARDII 250 MG PO CAPS
250.0000 mg | ORAL_CAPSULE | Freq: Two times a day (BID) | ORAL | Status: DC
  Administered 2017-08-07 – 2017-08-09 (×5): 250 mg via ORAL
  Filled 2017-08-07 (×5): qty 1

## 2017-08-07 MED ORDER — KETAMINE HCL-SODIUM CHLORIDE 100-0.9 MG/10ML-% IV SOSY
100.0000 mg | PREFILLED_SYRINGE | Freq: Once | INTRAVENOUS | Status: AC
  Administered 2017-08-07 (×3): 10 mg via INTRAVENOUS
  Administered 2017-08-07: 5 mg via INTRAVENOUS
  Administered 2017-08-07: 10 mg via INTRAVENOUS

## 2017-08-07 MED ORDER — ONDANSETRON HCL 4 MG PO TABS: 4.0000 mg | ORAL_TABLET | Freq: Four times a day (QID) | ORAL | Status: DC | PRN

## 2017-08-07 SURGICAL SUPPLY — 97 items
APPLIER CLIP 5 13 M/L LIGAMAX5 (MISCELLANEOUS)
APPLIER CLIP ROT 10 11.4 M/L (STAPLE)
BLADE EXTENDED COATED 6.5IN (ELECTRODE) ×2 IMPLANT
CANNULA REDUC XI 12-8 STAPL (CANNULA) ×1
CANNULA REDUCER 12-8 DVNC XI (CANNULA) ×1 IMPLANT
CELLS DAT CNTRL 66122 CELL SVR (MISCELLANEOUS) IMPLANT
CHLORAPREP W/TINT 26ML (MISCELLANEOUS) ×2 IMPLANT
CLIP APPLIE 5 13 M/L LIGAMAX5 (MISCELLANEOUS) IMPLANT
CLIP APPLIE ROT 10 11.4 M/L (STAPLE) IMPLANT
CLIP VESOLOCK LG 6/CT PURPLE (CLIP) IMPLANT
CLIP VESOLOCK MED LG 6/CT (CLIP) IMPLANT
COVER SURGICAL LIGHT HANDLE (MISCELLANEOUS) ×2 IMPLANT
COVER TIP SHEARS 8 DVNC (MISCELLANEOUS) ×1 IMPLANT
COVER TIP SHEARS 8MM DA VINCI (MISCELLANEOUS) ×1
DECANTER SPIKE VIAL GLASS SM (MISCELLANEOUS) ×2 IMPLANT
DEVICE PMI PUNCTURE CLOSURE (MISCELLANEOUS) ×2 IMPLANT
DEVICE TROCAR PUNCTURE CLOSURE (ENDOMECHANICALS) IMPLANT
DRAIN CHANNEL 19F RND (DRAIN) ×2 IMPLANT
DRAPE ARM DVNC X/XI (DISPOSABLE) ×3 IMPLANT
DRAPE COLUMN DVNC XI (DISPOSABLE) ×1 IMPLANT
DRAPE DA VINCI XI ARM (DISPOSABLE) ×3
DRAPE DA VINCI XI COLUMN (DISPOSABLE) ×1
DRAPE SURG IRRIG POUCH 19X23 (DRAPES) ×2 IMPLANT
DRSG OPSITE POSTOP 4X10 (GAUZE/BANDAGES/DRESSINGS) IMPLANT
DRSG OPSITE POSTOP 4X6 (GAUZE/BANDAGES/DRESSINGS) ×2 IMPLANT
DRSG OPSITE POSTOP 4X8 (GAUZE/BANDAGES/DRESSINGS) IMPLANT
DRSG TEGADERM 2-3/8X2-3/4 SM (GAUZE/BANDAGES/DRESSINGS) ×2 IMPLANT
DRSG TEGADERM 4X4.75 (GAUZE/BANDAGES/DRESSINGS) ×2 IMPLANT
ELECT PENCIL ROCKER SW 15FT (MISCELLANEOUS) ×2 IMPLANT
ELECT REM PT RETURN 15FT ADLT (MISCELLANEOUS) ×2 IMPLANT
ENDOLOOP SUT PDS II  0 18 (SUTURE)
ENDOLOOP SUT PDS II 0 18 (SUTURE) IMPLANT
EVACUATOR SILICONE 100CC (DRAIN) ×2 IMPLANT
GAUZE SPONGE 2X2 8PLY STRL LF (GAUZE/BANDAGES/DRESSINGS) ×1 IMPLANT
GAUZE SPONGE 4X4 12PLY STRL (GAUZE/BANDAGES/DRESSINGS) IMPLANT
GLOVE ECLIPSE 8.0 STRL XLNG CF (GLOVE) ×10 IMPLANT
GLOVE INDICATOR 8.0 STRL GRN (GLOVE) ×10 IMPLANT
GOWN STRL REUS W/TWL XL LVL3 (GOWN DISPOSABLE) ×10 IMPLANT
GRASPER ENDOPATH ANVIL 10MM (MISCELLANEOUS) IMPLANT
HOLDER FOLEY CATH W/STRAP (MISCELLANEOUS) ×2 IMPLANT
IRRIG SUCT STRYKERFLOW 2 WTIP (MISCELLANEOUS) ×2
IRRIGATION SUCT STRKRFLW 2 WTP (MISCELLANEOUS) ×1 IMPLANT
KIT PROCEDURE DA VINCI SI (MISCELLANEOUS) ×1
KIT PROCEDURE DVNC SI (MISCELLANEOUS) ×1 IMPLANT
LUBRICANT JELLY K Y 4OZ (MISCELLANEOUS) ×2 IMPLANT
NEEDLE INSUFFLATION 14GA 120MM (NEEDLE) ×2 IMPLANT
PACK CARDIOVASCULAR III (CUSTOM PROCEDURE TRAY) ×2 IMPLANT
PACK COLON (CUSTOM PROCEDURE TRAY) ×2 IMPLANT
PAD POSITIONING PINK XL (MISCELLANEOUS) ×2 IMPLANT
PORT LAP GEL ALEXIS MED 5-9CM (MISCELLANEOUS) ×2 IMPLANT
RTRCTR WOUND ALEXIS 18CM MED (MISCELLANEOUS)
SCISSORS LAP 5X35 DISP (ENDOMECHANICALS) ×2 IMPLANT
SEAL CANN UNIV 5-8 DVNC XI (MISCELLANEOUS) ×3 IMPLANT
SEAL XI 5MM-8MM UNIVERSAL (MISCELLANEOUS) ×3
SEALER VESSEL DA VINCI XI (MISCELLANEOUS) ×1
SEALER VESSEL EXT DVNC XI (MISCELLANEOUS) ×1 IMPLANT
SET IRRIG Y TYPE TUR BLADDER L (SET/KITS/TRAYS/PACK) ×2 IMPLANT
SLEEVE ADV FIXATION 5X100MM (TROCAR) ×2 IMPLANT
SOLUTION ELECTROLUBE (MISCELLANEOUS) ×2 IMPLANT
SPONGE GAUZE 2X2 STER 10/PKG (GAUZE/BANDAGES/DRESSINGS) ×1
STAPLER 45 BLU RELOAD XI (STAPLE) IMPLANT
STAPLER 45 BLUE RELOAD XI (STAPLE)
STAPLER 45 GREEN RELOAD XI (STAPLE) ×2
STAPLER 45 GRN RELOAD XI (STAPLE) ×2 IMPLANT
STAPLER CANNULA SEAL DVNC XI (STAPLE) ×1 IMPLANT
STAPLER CANNULA SEAL XI (STAPLE) ×1
STAPLER CIRC ILS CVD 33MM 37CM (STAPLE) ×2 IMPLANT
STAPLER SHEATH (SHEATH) ×1
STAPLER SHEATH ENDOWRIST DVNC (SHEATH) ×1 IMPLANT
SUT MNCRL AB 4-0 PS2 18 (SUTURE) ×2 IMPLANT
SUT PDS AB 1 CTX 36 (SUTURE) IMPLANT
SUT PDS AB 1 TP1 96 (SUTURE) IMPLANT
SUT PDS AB 2-0 CT2 27 (SUTURE) IMPLANT
SUT PROLENE 0 CT 2 (SUTURE) ×2 IMPLANT
SUT PROLENE 2 0 KS (SUTURE) IMPLANT
SUT PROLENE 2 0 SH DA (SUTURE) IMPLANT
SUT SILK 2 0 (SUTURE) ×1
SUT SILK 2 0 SH CR/8 (SUTURE) ×2 IMPLANT
SUT SILK 2-0 18XBRD TIE 12 (SUTURE) ×1 IMPLANT
SUT SILK 3 0 (SUTURE) ×1
SUT SILK 3 0 SH CR/8 (SUTURE) ×2 IMPLANT
SUT SILK 3-0 18XBRD TIE 12 (SUTURE) ×1 IMPLANT
SUT V-LOC BARB 180 2/0GR6 GS22 (SUTURE)
SUT VIC AB 3-0 SH 18 (SUTURE) ×2 IMPLANT
SUT VIC AB 3-0 SH 27 (SUTURE) ×1
SUT VIC AB 3-0 SH 27XBRD (SUTURE) ×1 IMPLANT
SUT VICRYL 0 UR6 27IN ABS (SUTURE) ×2 IMPLANT
SUTURE V-LC BRB 180 2/0GR6GS22 (SUTURE) IMPLANT
SYR 10ML LL (SYRINGE) ×2 IMPLANT
SYS LAPSCP GELPORT 120MM (MISCELLANEOUS)
SYSTEM LAPSCP GELPORT 120MM (MISCELLANEOUS) IMPLANT
TAPE UMBILICAL COTTON 1/8X30 (MISCELLANEOUS) ×2 IMPLANT
TOWEL OR NON WOVEN STRL DISP B (DISPOSABLE) ×2 IMPLANT
TRAY FOLEY W/METER SILVER 16FR (SET/KITS/TRAYS/PACK) ×2 IMPLANT
TROCAR ADV FIXATION 5X100MM (TROCAR) ×2 IMPLANT
TUBING CONNECTING 10 (TUBING) ×4 IMPLANT
TUBING INSUFFLATION 10FT LAP (TUBING) ×2 IMPLANT

## 2017-08-07 NOTE — Transfer of Care (Signed)
Immediate Anesthesia Transfer of Care Note  Patient: Brian Maddox  Procedure(s) Performed: Procedure(s) with comments: XI ROBOT DISTAL SIGMOID COLECTOMY  WITH LYSIS OF ADHESIONS ERAS PATHWAY (N/A) - ERAS PATHWAY RIGID PROCTOSCOPY (N/A)  Patient Location: PACU  Anesthesia Type:General  Level of Consciousness: awake, alert  and oriented  Airway & Oxygen Therapy: Patient Spontanous Breathing and Patient connected to face mask oxygen  Post-op Assessment: Report given to RN and Post -op Vital signs reviewed and stable  Post vital signs: Reviewed and stable  Last Vitals:  Vitals:   08/07/17 1330 08/07/17 1332  BP:  137/79  Pulse: 92 (!) 105  Resp: 20 15  Temp:    SpO2: 100% 100%    Last Pain:  Vitals:   08/07/17 0821  TempSrc: Oral         Complications: No apparent anesthesia complications

## 2017-08-07 NOTE — Anesthesia Procedure Notes (Signed)
Procedure Name: Intubation Date/Time: 08/07/2017 10:15 AM Performed by: Thornell Mule Pre-anesthesia Checklist: Patient identified, Emergency Drugs available, Suction available and Patient being monitored Patient Re-evaluated:Patient Re-evaluated prior to induction Oxygen Delivery Method: Circle system utilized Preoxygenation: Pre-oxygenation with 100% oxygen Induction Type: IV induction Ventilation: Mask ventilation without difficulty Laryngoscope Size: Miller and 3 Grade View: Grade I Tube type: Oral Tube size: 7.5 mm Number of attempts: 1 Airway Equipment and Method: Stylet and Oral airway Placement Confirmation: ETT inserted through vocal cords under direct vision,  positive ETCO2 and breath sounds checked- equal and bilateral Secured at: 22 cm Tube secured with: Tape Dental Injury: Teeth and Oropharynx as per pre-operative assessment

## 2017-08-07 NOTE — Discharge Instructions (Signed)
SURGERY: POST OP INSTRUCTIONS °(Surgery for small bowel obstruction, colon resection, etc) ° ° °###################################################################### ° °EAT °Gradually transition to a high fiber diet with a fiber supplement over the next few days after discharge ° °WALK °Walk an hour a day.  Control your pain to do that.   ° °CONTROL PAIN °Control pain so that you can walk, sleep, tolerate sneezing/coughing, go up/down stairs. ° °HAVE A BOWEL MOVEMENT DAILY °Keep your bowels regular to avoid problems.  OK to try a laxative to override constipation.  OK to use an antidairrheal to slow down diarrhea.  Call if not better after 2 tries ° °CALL IF YOU HAVE PROBLEMS/CONCERNS °Call if you are still struggling despite following these instructions. °Call if you have concerns not answered by these instructions ° °###################################################################### ° ° °DIET °Follow a light diet the first few days at home.  Start with a bland diet such as soups, liquids, starchy foods, low fat foods, etc.  If you feel full, bloated, or constipated, stay on a ful liquid or pureed/blenderized diet for a few days until you feel better and no longer constipated. °Be sure to drink plenty of fluids every day to avoid getting dehydrated (feeling dizzy, not urinating, etc.). °Gradually add a fiber supplement to your diet over the next week.  Gradually get back to a regular solid diet.  Avoid fast food or heavy meals the first week as you are more likely to get nauseated. °It is expected for your digestive tract to need a few months to get back to normal.  It is common for your bowel movements and stools to be irregular.  You will have occasional bloating and cramping that should eventually fade away.  Until you are eating solid food normally, off all pain medications, and back to regular activities; your bowels will not be normal. °Focus on eating a low-fat, high fiber diet the rest of your life  (See Getting to Good Bowel Health, below). ° °CARE of your INCISION or WOUND °It is good for closed incision and even open wounds to be washed every day.  Shower every day.  Short baths are fine.  Wash the incisions and wounds clean with soap & water.    °If you have a closed incision(s), wash the incision with soap & water every day.  You may leave closed incisions open to air if it is dry.   You may cover the incision with clean gauze & replace it after your daily shower for comfort. °If you have skin tapes (Steristrips) or skin glue (Dermabond) on your incision, leave them in place.  They will fall off on their own like a scab.  You may trim any edges that curl up with clean scissors.  If you have staples, set up an appointment for them to be removed in the office in 10 days after surgery.  °If you have a drain, wash around the skin exit site with soap & water and place a new dressing of gauze or band aid around the skin every day.  Keep the drain site clean & dry.    °If you have an open wound with packing, see wound care instructions.  In general, it is encouraged that you remove your dressing and packing, shower with soap & water, and replace your dressing once a day.  Pack the wound with clean gauze moistened with normal (0.9%) saline to keep the wound moist & uninfected.  Pressure on the dressing for 30 minutes will stop most wound   bleeding.  Eventually your body will heal & pull the open wound closed over the next few months.  °Raw open wounds will occasionally bleed or secrete yellow drainage until it heals closed.  Drain sites will drain a little until the drain is removed.  Even closed incisions can have mild bleeding or drainage the first few days until the skin edges scab over & seal.   °If you have an open wound with a wound vac, see wound vac care instructions. ° ° ° ° °ACTIVITIES as tolerated °Start light daily activities --- self-care, walking, climbing stairs-- beginning the day after surgery.   Gradually increase activities as tolerated.  Control your pain to be active.  Stop when you are tired.  Ideally, walk several times a day, eventually an hour a day.   °Most people are back to most day-to-day activities in a few weeks.  It takes 4-8 weeks to get back to unrestricted, intense activity. °If you can walk 30 minutes without difficulty, it is safe to try more intense activity such as jogging, treadmill, bicycling, low-impact aerobics, swimming, etc. °Save the most intensive and strenuous activity for last (Usually 4-8 weeks after surgery) such as sit-ups, heavy lifting, contact sports, etc.  Refrain from any intense heavy lifting or straining until you are off narcotics for pain control.  You will have off days, but things should improve week-by-week. °DO NOT PUSH THROUGH PAIN.  Let pain be your guide: If it hurts to do something, don't do it.  Pain is your body warning you to avoid that activity for another week until the pain goes down. °You may drive when you are no longer taking narcotic prescription pain medication, you can comfortably wear a seatbelt, and you can safely make sudden turns/stops to protect yourself without hesitating due to pain. °You may have sexual intercourse when it is comfortable. If it hurts to do something, stop. ° °MEDICATIONS °Take your usually prescribed home medications unless otherwise directed.   °Blood thinners:  °Usually you can restart any strong blood thinners after the second postoperative day.  It is OK to take aspirin right away.    ° If you are on strong blood thinners (warfarin/Coumadin, Plavix, Xerelto, Eliquis, Pradaxa, etc), discuss with your surgeon, medicine PCP, and/or cardiologist for instructions on when to restart the blood thinner & if blood monitoring is needed (PT/INR blood check, etc).   ° ° °PAIN CONTROL °Pain after surgery or related to activity is often due to strain/injury to muscle, tendon, nerves and/or incisions.  This pain is usually  short-term and will improve in a few months.  °To help speed the process of healing and to get back to regular activity more quickly, DO THE FOLLOWING THINGS TOGETHER: °1. Increase activity gradually.  DO NOT PUSH THROUGH PAIN °2. Use Ice and/or Heat °3. Try Gentle Massage and/or Stretching °4. Take over the counter pain medication °5. Take Narcotic prescription pain medication for more severe pain ° °Good pain control = faster recovery.  It is better to take more medicine to be more active than to stay in bed all day to avoid medications. °1.  Increase activity gradually °Avoid heavy lifting at first, then increase to lifting as tolerated over the next 6 weeks. °Do not “push through” the pain.  Listen to your body and avoid positions and maneuvers than reproduce the pain.  Wait a few days before trying something more intense °Walking an hour a day is encouraged to help your body recover faster   and more safely.  Start slowly and stop when getting sore.  If you can walk 30 minutes without stopping or pain, you can try more intense activity (running, jogging, aerobics, cycling, swimming, treadmill, sex, sports, weightlifting, etc.) °Remember: If it hurts to do it, then don’t do it! °2. Use Ice and/or Heat °You will have swelling and bruising around the incisions.  This will take several weeks to resolve. °Ice packs or heating pads (6-8 times a day, 30-60 minutes at a time) will help sooth soreness & bruising. °Some people prefer to use ice alone, heat alone, or alternate between ice & heat.  Experiment and see what works best for you.  Consider trying ice for the first few days to help decrease swelling and bruising; then, switch to heat to help relax sore spots and speed recovery. °Shower every day.  Short baths are fine.  It feels good!  Keep the incisions and wounds clean with soap & water.   °3. Try Gentle Massage and/or Stretching °Massage at the area of pain many times a day °Stop if you feel pain - do not  overdo it °4. Take over the counter pain medication °This helps the muscle and nerve tissues become less irritable and calm down faster °Choose ONE of the following over-the-counter anti-inflammatory medications: °Acetaminophen 500mg tabs (Tylenol) 1-2 pills with every meal and just before bedtime (avoid if you have liver problems or if you have acetaminophen in you narcotic prescription) °Naproxen 220mg tabs (ex. Aleve, Naprosyn) 1-2 pills twice a day (avoid if you have kidney, stomach, IBD, or bleeding problems) °Ibuprofen 200mg tabs (ex. Advil, Motrin) 3-4 pills with every meal and just before bedtime (avoid if you have kidney, stomach, IBD, or bleeding problems) °Take with food/snack several times a day as directed for at least 2 weeks to help keep pain / soreness down & more manageable. °5. Take Narcotic prescription pain medication for more severe pain °A prescription for strong pain control is often given to you upon discharge (for example: oxycodone/Percocet, hydrocodone/Norco/Vicodin, or tramadol/Ultram) °Take your pain medication as prescribed. °Be mindful that most narcotic prescriptions contain Tylenol (acetaminophen) as well - avoid taking too much Tylenol. °If you are having problems/concerns with the prescription medicine (does not control pain, nausea, vomiting, rash, itching, etc.), please call us [phone] to see if we need to switch you to a different pain medicine that will work better for you and/or control your side effects better. °If you need a refill on your pain medication, you must call the office before 4 pm and on weekdays only.  By federal law, prescriptions for narcotics cannot be called into a pharmacy.  They must be filled out on paper & picked up from our office by the patient or authorized caretaker.  Prescriptions cannot be filled after 4 pm nor on weekends.   ° °WHEN TO CALL US [phone] °Severe uncontrolled or worsening pain  °Fever over 101 F (38.5 C) °Concerns with  the incision: Worsening pain, redness, rash/hives, swelling, bleeding, or drainage °Reactions / problems with new medications (itching, rash, hives, nausea, etc.) °Nausea and/or vomiting °Difficulty urinating °Difficulty breathing °Worsening fatigue, dizziness, lightheadedness, blurred vision °Other concerns °If you are not getting better after two weeks or are noticing you are getting worse, contact our office [phone] for further advice.  We may need to adjust your medications, re-evaluate you in the office, send you to the emergency room, or see what other things we can do to help. °The   clinic staff is available to answer your questions during regular business hours (8:30am-5pm).  Please don’t hesitate to call and ask to speak to one of our nurses for clinical concerns.    °A surgeon from [facility] is always on call at the hospitals 24 hours/day °If you have a medical emergency, go to the nearest emergency room or call 911. ° °FOLLOW UP in our office °One the day of your discharge from the hospital (or the next business weekday), please call [facility] to set up or confirm an appointment to see your surgeon in the office for a follow-up appointment.  Usually it is 2-3 weeks after your surgery.   °If you have skin staples at your incision(s), let the office know so we can set up a time in the office for the nurse to remove them (usually around 10 days after surgery). °Make sure that you call for appointments the day of discharge (or the next business weekday) from the hospital to ensure a convenient appointment time. °IF YOU HAVE DISABILITY OR FAMILY LEAVE FORMS, BRING THEM TO THE OFFICE FOR PROCESSING.  DO NOT GIVE THEM TO YOUR DOCTOR. ° °[facility] °[address], [address], [place], [place]  [address] ? °[phone] - Main °[phone] - Toll Free,  [phone] - Fax °[email or web address] ° °GETTING TO GOOD BOWEL HEALTH. °It is  expected for your digestive tract to need a few months to get back to normal.  It is common for your bowel movements and stools to be irregular.  You will have occasional bloating and cramping that should eventually fade away.  Until you are eating solid food normally, off all pain medications, and back to regular activities; your bowels will not be normal.   °Avoiding constipation °The goal: ONE SOFT BOWEL MOVEMENT A DAY!    °Drink plenty of fluids.  Choose water first. °TAKE A FIBER SUPPLEMENT EVERY DAY THE REST OF YOUR LIFE °During your first week back home, gradually add back a fiber supplement every day °Experiment which form you can tolerate.   There are many forms such as powders, tablets, wafers, gummies, etc °Psyllium bran (Metamucil), methylcellulose (Citrucel), Miralax or Glycolax, Benefiber, Flax Seed.  °Adjust the dose week-by-week (1/2 dose/day to 6 doses a day) until you are moving your bowels 1-2 times a day.  Cut back the dose or try a different fiber product if it is giving you problems such as diarrhea or bloating. °Sometimes a laxative is needed to help jump-start bowels if constipated until the fiber supplement can help regulate your bowels.  If you are tolerating eating & you are farting, it is okay to try a gentle laxative such as double dose MiraLax, prune juice, or Milk of Magnesia.  Avoid using laxatives too often. °Stool softeners can sometimes help counteract the constipating effects of narcotic pain medicines.  It can also cause diarrhea, so avoid using for too long. °If you are still constipated despite taking fiber daily, eating solids, and a few doses of laxatives, call our office. °Controlling diarrhea °Try drinking liquids and eating bland foods for a few days to avoid stressing your intestines further. °Avoid dairy products (especially milk & ice cream) for a short time.  The intestines often can lose the ability to digest lactose when stressed. °Avoid foods that cause gassiness or  bloating.  Typical foods include beans and other legumes, cabbage, broccoli, and dairy foods.  Avoid greasy, spicy, fast foods.  Every person has   some sensitivity to other foods, so listen to your body and avoid those foods that trigger problems for you. °Probiotics (such as active yogurt, Align, etc) may help repopulate the intestines and colon with normal bacteria and calm down a sensitive digestive tract °Adding a fiber supplement gradually can help thicken stools by absorbing excess fluid and retrain the intestines to act more normally.  Slowly increase the dose over a few weeks.  Too much fiber too soon can backfire and cause cramping & bloating. °It is okay to try and slow down diarrhea with a few doses of antidiarrheal medicines.   °Bismuth subsalicylate (ex. Kayopectate, Pepto Bismol) for a few doses can help control diarrhea.  Avoid if pregnant.   °Loperamide (Imodium) can slow down diarrhea.  Start with one tablet (2mg) first.  Avoid if you are having fevers or severe pain.  °ILEOSTOMY PATIENTS WILL HAVE CHRONIC DIARRHEA since their colon is not in use.    °Drink plenty of liquids.  You will need to drink even more glasses of water/liquid a day to avoid getting dehydrated. °Record output from your ileostomy.  Expect to empty the bag every 3-4 hours at first.  Most people with a permanent ileostomy empty their bag 4-6 times at the least.   °Use antidiarrheal medicine (especially Imodium) several times a day to avoid getting dehydrated.  Start with a dose at bedtime & breakfast.  Adjust up or down as needed.  Increase antidiarrheal medications as directed to avoid emptying the bag more than 8 times a day (every 3 hours). °Work with your wound ostomy nurse to learn care for your ostomy.  See ostomy care instructions. °TROUBLESHOOTING IRREGULAR BOWELS °1) Start with a soft & bland diet. No spicy, greasy, or fried foods.  °2) Avoid gluten/wheat or dairy products from diet to see if symptoms improve. °3) Miralax  17gm or flax seed mixed in 8oz. water or juice-daily. May use 2-4 times a day as needed. °4) Gas-X, Phazyme, etc. as needed for gas & bloating.  °5) Prilosec (omeprazole) over-the-counter as needed °6)  Consider probiotics (Align, Activa, etc) to help calm the bowels down ° °Call your doctor if you are getting worse or not getting better.  Sometimes further testing (cultures, endoscopy, X-ray studies, CT scans, bloodwork, etc.) may be needed to help diagnose and treat the cause of the diarrhea. °[facility] °[address], [address], [place], [place]  [address] °[phone] - Main.    °[phone]  - Toll Free.   [phone] - Fax °[email or web address] ° ° °Diverticulitis °Diverticulitis is inflammation or infection of small pouches in your colon that form when you have a condition called diverticulosis. The pouches in your colon are called diverticula. Your colon, or large intestine, is where water is absorbed and stool is formed. °Complications of diverticulitis can include: °· Bleeding. °· Severe infection. °· Severe pain. °· Perforation of your colon. °· Obstruction of your colon. °What are the causes? °Diverticulitis is caused by bacteria. °Diverticulitis happens when stool becomes trapped in diverticula. This allows bacteria to grow in the diverticula, which can lead to inflammation and infection. °What increases the risk? °People with diverticulosis are at risk for diverticulitis. Eating a diet that does not include enough fiber from fruits and vegetables may make diverticulitis more likely to develop. °What are the signs or symptoms? °Symptoms of diverticulitis may include: °· Abdominal pain and tenderness. The pain is normally located on the left side of the abdomen, but may occur   in other areas. °· Fever and chills. °· Bloating. °· Cramping. °· Nausea. °· Vomiting. °· Constipation. °· Diarrhea. °· Blood in your stool. °How is this diagnosed? °Your health care  provider will ask you about your medical history and do a physical exam. You may need to have tests done because many medical conditions can cause the same symptoms as diverticulitis. Tests may include: °· Blood tests. °· Urine tests. °· Imaging tests of the abdomen, including X-rays and CT scans. °When your condition is under control, your health care provider may recommend that you have a colonoscopy. A colonoscopy can show how severe your diverticula are and whether something else is causing your symptoms. °How is this treated? °Most cases of diverticulitis are mild and can be treated at home. Treatment may include: °· Taking over-the-counter pain medicines. °· Following a clear liquid diet. °· Taking antibiotic medicines by mouth for 7-10 days. °More severe cases may be treated at a hospital. Treatment may include: °· Not eating or drinking. °· Taking prescription pain medicine. °· Receiving antibiotic medicines through an IV tube. °· Receiving fluids and nutrition through an IV tube. °· Surgery. °Follow these instructions at home: °· Follow your health care provider’s instructions carefully. °· Follow a full liquid diet or other diet as directed by your health care provider. After your symptoms improve, your health care provider may tell you to change your diet. He or she may recommend you eat a high-fiber diet. Fruits and vegetables are good sources of fiber. Fiber makes it easier to pass stool. °· Take fiber supplements or probiotics as directed by your health care provider. °· Only take medicines as directed by your health care provider. °· Keep all your follow-up appointments. °Contact a health care provider if: °· Your pain does not improve. °· You have a hard time eating food. °· Your bowel movements do not return to normal. °Get help right away if: °· Your pain becomes worse. °· Your symptoms do not get better. °· Your symptoms suddenly get worse. °· You have a fever. °· You have repeated  vomiting. °· You have bloody or black, tarry stools. °This information is not intended to replace advice given to you by your health care provider. Make sure you discuss any questions you have with your health care provider. °Document Released: 09/05/2005 Document Revised: 05/03/2016 Document Reviewed: 10/21/2013 °[employer] Interactive Patient Education © 2017 [employer] ° °

## 2017-08-07 NOTE — Op Note (Addendum)
08/07/2017  1:18 PM  PATIENT:  Brian Maddox  46 y.o. 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  ANESTHESIA:   local and general  EBL:  Total I/O In: 1000 [I.V.:1000] Out: 200 [Urine:150; Blood:50]  Delay start of Pharmacological VTE agent (>24hrs) due to surgical blood loss or risk of bleeding:  no  DRAINS: none   SPECIMEN:  RECTOSIGMOID COLON.  (open end is proximal)  DISPOSITION OF SPECIMEN:  PATHOLOGY  COUNTS:  YES  PLAN OF CARE: Admit to inpatient   PATIENT DISPOSITION:  PACU - hemodynamically stable.  INDICATION:    Patient with episodes of recurrent sigmoid diverticulitis.  Evidence of gas and bladder and urinary inflammation consistent with colovesical fistula.  Attacks requiring chronic oral antibiotics.  Colonoscopy was evidence of any cancer or tumor.  Most consistent with diverticulitis.  I recommended segmental resection:  The anatomy & physiology of the digestive tract was discussed.  The pathophysiology was discussed.  Natural history risks without surgery was discussed.   I worked to give an overview of the disease and the frequent need to have multispecialty involvement.  I feel the risks of no intervention will lead to serious problems that outweigh the operative risks; therefore, I recommended a partial colectomy to remove the pathology.   Laparoscopic & open techniques were discussed.   Risks such as bleeding, infection, abscess, leak, reoperation, possible ostomy, hernia, heart attack, death, and other risks were discussed.  I noted a good likelihood this will help address the problem.   Goals of post-operative recovery were discussed as well.  We will work to minimize complications.  Educational materials on the pathology had been given in the office.  Questions were answered.    The patient expressed understanding & wished to proceed with surgery.  OR FINDINGS:   Patient had .  Inflamed and foreshortened sigmoid colon densely adherent to the left pelvis and left dome of the bladder.  No evidence of any persistent fistula to the bladder at this time.  No obvious metastatic disease on visceral parietal peritoneum or liver.  The anastomosis rests 16 cm from the anal verge by rigid proctoscopy.  DESCRIPTION:   Informed consent was confirmed.  The patient underwent general anaesthesia without difficulty.  The patient was positioned appropriately.  VTE prevention in place.  The patient's abdomen was clipped, prepped, & draped in a sterile fashion.  Surgical timeout confirmed our plan.  The patient was positioned in reverse Trendelenburg.  Abdominal entry was gained using Varess technique with a trach hook on the anterior abdominal wall fascia in the left upper abdomen.  Entry was clean.  I induced carbon dioxide insufflation.  Camera inspection revealed no injury.  Extra ports were carefully placed under direct laparoscopic visualization.  Did lyse lesions to free the greater omentum off the left pelvis to better expose the obviously inflamed and foreshortened sigmoid colon.     I reflected the greater omentum and the upper abdomen the  small bowel in the upper abdomen.  The patient was carefully positioned.  The Intuitive daVinci robot was carefully docked with camera & instruments carefully placed.  The patient had very inflamed  and thickened rectosigmoid colon.  Densely plastered to the left pelvis and left dome of the bladder.  Patient's pelvis also had a moderate volume of small bowel with adhesions also to the right pelvic sidewall.  Mobilized the ileocecal mesentery in inferior to superior fashion to help reflect that out of the way. The right ureter and gonadal vessels were left in the retroperitoneum unharmed.  I scored the base of peritoneum of the medial side of the mesentery of the left colon from the ligament of Treitz to the peritoneal reflection of the mid rectum.   I elevated the sigmoid mesentery and entered into the retro-mesenteric plane. We were able to identify the left ureter and gonadal vessels. We kept those posterior within the retroperitoneum and elevated the left colon mesentery off that.  Did careful meticulous dissection to free off the very inflamed rectosigmoid colon off the pelvic sidewall and retroperitoneum.  He was most densely adherent along the left dome the bladder.  Freed that off.  We infused the bladder with 500 mL of isotonic solution containing methylene blue.  Got excellent bladder distention.  No evidence of any leak or other abnormality.  Therefore no evidence of any active colovesical fistula.  I returned towards resection of the mesentery of the rectosigmoid colon.  I did isolate the inferior mesenteric artery (IMA) pedicle but did not ligate it yet.  I continued distally and got into the avascular plane posterior to the mesorectum. This allowed me to help mobilize the rectum as well by freeing the mesorectum off the sacrum.  I mobilized the peritoneal coverings towards the peritoneal reflection on both the right and left sides of the rectum.  I stayed away from the right and left ureters.  I kept the lateral vascular pedicles to the rectum intact.  I skeletonized the lymph nodes off the inferior mesenteric artery pedicle.  I went down to its takeoff from the aorta.  I isolated the inferior  mesenteric vein off of the ligament of Treitz just cephalad to that as well.  After confirming the left ureter was out of the way, I went ahead and ligated the inferior mesenteric artery pedicle just near its takeoff from the aorta.  I did ligate the inferior mesenteric vein in a similar fashion.  We ensured hemostasis. I skeletonized the mesorectum at the junction at the proximal rectum for the distal point of resection.  I mobilized the left colon in a lateral to medial fashion off the line of Toldt up towards the splenic flexure to ensure good mobilization of the remaining left colon to reach into the pelvis.  And in freeing up towards the inferior pancreatic ridge.  Did about 80% of a splenic flexure mobilization.  With that I could get the mid descending colon to reach down into the pelvis.  I skeletonized at the proximal mesorectum and transected at the proximal rectum using a robotic 45 mm stapler.  90% on the first firing.  Had to do the left lateral corner with one extra firing.  I chose a region at the descending/sigmoid junction that was soft and easily reached down to the rectal stump.  I transected the mesentery of the colon radially to preserve remaining colon blood supply.  Did inspection to confirm that ureters and gonadal vessels were in the  retroperitoneum unharmed.  Hemostasis was good.  I created an extraction incision through a small Pfannenstiel incision in the right suprapubic region, going through the 12 stapler port that had been placed in the right suprapubic region..  Placed a wound protector.  I was able to eviscerate the rectosigmoid and descending colon out the wound.   Very inflamed and foreshortened rectosigmoid colon.  I clamped the colon proximal to this area using a reusable pursestringer device.  Passed a 2-0 Keith needle. I transected at the descending/sigmoid junction with a scalpel. I got healthy bleeding mucosa.  We sent the rectosigmoid colon specimen off to go to  pathology.  We sized the colon orifice.  I chose a 33 EEA anvil stapler system.  I reinforced the prolene pursestring with interrupted silk suture.  I placed the anvil to the open end of the proximal remaining colon and closed around it using the pursestring.    We did copious irrigation with crystalloid solution.  Hemostasis was good.  The distal end of the remaining colon easily reached down to the rectal stump, therefore, further splenic flexure mobilization was not needed.      Dr Windy Carinahaoms scrubbed down and did gentle anal dilation and advanced the EEA stapler up the rectal stump. The spike was brought out at the provimal end of the rectal stump under direct visualization.  I attached the anvil of the proximal colon the spike of the stapler. Anvil was tightened down and held clamped for 60 seconds. The EEA stapler was fired and held clamped for 30 seconds. The stapler was released & removed.  We noted 2 excellent anastomotic rings. Blue stitch is in the proximal ring.  Dr Josetta Huddlehoams did rigid proctoscopy noted the anastomosis was at 16 cm from the anal verge consistent with the proximal rectum.  We did a final irrigation of antibiotic solution (900 mg clindamycin/240 mg gentamicin in a liter of crystalloid) & held that for the pelvic air leak test.  The rectum was insufflated the rectum while clamping the colon proximal to that anastomosis.  There was a negative air leak test. There was no tension of mesentery or bowel at the anastomosis.   Tissues looked viable.  Ureters & bowel uninjured.  The anastomosis looked healthy.  Greater omentum allowed to come down to help minimize tension off the anastomosis as well.  Ports & wound protector removed.  We changed gloves & redraped the patient per colon SSI prevention protocol.  We aspirated the antibiotic irrigation.  Hemostasis was good.  Sterile unused instruments were used from this point.  I closed the skin at the port sites using Monocryl stitch and sterile  dressing.  I closed the extraction wound using a 0 Vicryl vertical peritoneal closure and a #1 PDS transverse anterior rectal fascial closure like a small Pfannenstiel closure. I closed the skin with some interrupted Monocryl stitches. I placed antibiotic-soaked wicks into the closure at the corners x2.  I placed sterile dressings.     Patient is being extubated go to recovery room. I had discussed postop care with the patient in detail the office & with the patient and his spouse again in the holding area. Instructions are written.  I'm about to locate family and discuss it with them as well.  Ardeth SportsmanSteven C. Alyzza Andringa, M.D., F.A.C.S. Gastrointestinal and Minimally Invasive Surgery [facility] [address] [place], [place] [id number] [phone] Main / Paging

## 2017-08-07 NOTE — Anesthesia Preprocedure Evaluation (Addendum)
Anesthesia Evaluation  Patient identified by MRN, date of birth, ID band Patient awake    Reviewed: Allergy & Precautions, NPO status , Patient's Chart, lab work & pertinent test results  Airway Mallampati: III  TM Distance: >3 FB Neck ROM: Full    Dental no notable dental hx.    Pulmonary neg pulmonary ROS,    Pulmonary exam normal breath sounds clear to auscultation       Cardiovascular negative cardio ROS Normal cardiovascular exam Rhythm:Regular Rate:Normal     Neuro/Psych negative neurological ROS  negative psych ROS   GI/Hepatic Neg liver ROS, Colovesical fistula with sigmoid diverticulitis   Endo/Other  negative endocrine ROS  Renal/GU negative Renal ROS     Musculoskeletal negative musculoskeletal ROS (+)   Abdominal   Peds  Hematology negative hematology ROS (+)   Anesthesia Other Findings   Reproductive/Obstetrics                            Anesthesia Physical Anesthesia Plan  ASA: II  Anesthesia Plan: General   Post-op Pain Management:    Induction: Intravenous  PONV Risk Score and Plan: 2 and Ondansetron, Dexamethasone and Midazolam  Airway Management Planned: Oral ETT  Additional Equipment:   Intra-op Plan:   Post-operative Plan: Extubation in OR  Informed Consent: I have reviewed the patients History and Physical, chart, labs and discussed the procedure including the risks, benefits and alternatives for the proposed anesthesia with the patient or authorized representative who has indicated his/her understanding and acceptance.   Dental advisory given  Plan Discussed with: CRNA  Anesthesia Plan Comments:         Anesthesia Quick Evaluation

## 2017-08-07 NOTE — Anesthesia Postprocedure Evaluation (Signed)
Anesthesia Post Note  Patient: Brian Maddox  Procedure(s) Performed: Procedure(s) (LRB): XI ROBOT DISTAL SIGMOID COLECTOMY  WITH LYSIS OF ADHESIONS ERAS PATHWAY (N/A) RIGID PROCTOSCOPY (N/A)     Patient location during evaluation: PACU Anesthesia Type: General Level of consciousness: awake and alert Pain management: pain level controlled Vital Signs Assessment: post-procedure vital signs reviewed and stable Respiratory status: spontaneous breathing, nonlabored ventilation, respiratory function stable and patient connected to nasal cannula oxygen Cardiovascular status: blood pressure returned to baseline and stable Postop Assessment: no signs of nausea or vomiting Anesthetic complications: no    Last Vitals:  Vitals:   08/07/17 1445 08/07/17 1459  BP: 122/77 136/69  Pulse: 82   Resp: 16 16  Temp: 36.7 C 36.8 C  SpO2: 100% 100%    Last Pain:  Vitals:   08/07/17 0821  TempSrc: Oral                 Ryan P Ellender

## 2017-08-07 NOTE — H&P (Signed)
Brian Maddox 06/20/2017 2:31 PM Location: [facility] Patient #: [id number] DOB: 11/07/1971 Undefined / Language: [place] / Race: White Male  Patient Care Team: Estanislado PandySasser, Paul W, MD as PCP - General (Family Medicine) Karie SodaGross, Caid Radin, MD as Consulting Physician (General Surgery) Marcine Matarahlstedt, Stephen, MD as Consulting Physician (Urology) Malissa Hippoehman, Najeeb U, MD as Consulting Physician (Gastroenterology) Len BlalockSetzer, Terri L, NP as Nurse Practitioner (Internal Medicine)   History of Present Illness   The patient is a 46 year old male who presents with colovesical fistula. Note for "Colovesical fistula": ` ` ` 46 year old male sent for surgical consultation the request of Dr. Patsi SearsSteven Dahlstedt. [facility]. Concern for colovesical fistula.  Pleasant active male. History of intermittent lower abdominal crampy pain and irregular bowels. Can have loose bowel movements over 10 times a day sometimes. Normally tries to manage it. Thought was related to his intense exercise and diet. However he started having difficulty urinating. Feeling he had strained her unkink hose. Some discomfort. Then noticed pneumaturia. Was concerned. Sent to see urology. Concern for possible prostatitis. No improvement on antibiotics. CT scan done. Showed gas in the bladder and inflammation of diverticulitis. Strongly suspicious for colo-vesicle fistula. Patient's attacks seem to calm down on oral antibiotics. Cipro/Flagyl. Underwent colonoscopy 2 weeks ago. Diverticulitis confirmed. No other abnormalities. Surgical consultation requested.  Patient finished antibiotics last week. No new flares. Still with some urgency with bowel movements. No murmur pneumaturia. No urinary symptoms at this time. Patient does not smoke. 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 [place]. As an infant he had inguinal hernia and ureter  reimplantation. No other abdominal surgeries.   Problem List/Past Medical Brian Maddox(Brian Dockery, LPN; 0/45/40987/11/2017 1:192:38 PM) Back Pain  Hernia (inguinal)  when he was a child Diverticulitis Of Colon  Enlarged Prostate   Past Surgical History Brian Maddox(Brian Dockery, LPN; 1/47/82957/11/2017 6:212:37 PM) Vasectomy  Knee Surgery - Right  Anal Fissure Repair   Allergies Brian Maddox(Brian Dockery, LPN; 3/08/65787/11/2017 4:692:32 PM) No Known Allergies 06/20/2017 Allergies Reconciled   Medication History Brian Maddox(Brian Dockery, LPN; 6/29/52847/11/2017 1:322:32 PM) No Current Medications Medications Reconciled  Social History Brian Maddox(Brian Dockery, LPN; 4/40/10277/11/2017 2:532:39 PM) No drug use  Never smoked  Former smoker.  Family History Brian Maddox(Brian Dockery, LPN; 6/64/40347/11/2017 7:422:44 PM) Alcohol Abuse  Brother. Arthritis  Father. Colon Polyps  Father. Depression  Mother. Heart Disease  Father. Lung/Respiratory Disease  Father. Migraine Headache  Mother. Seizure disorder  Brother. Thyroid problems  Mother. Other Bowel Disease  Father. Other Cancer  Mother.  History Addendum Note(Brian Dockery LPN; 5/95/63877/13/2018 56:4310:56 AM) Diagnostic Studies History Brian Maddox(Brian Dockery, LPN; 3/29/51887/11/2017 4:164:14 PM) Colonoscopy     Review of Systems  Addendum Note(Brian Dockery LPN; 6/06/30167/13/2018 01:0910:55 AM) Review of Systems Brian Maddox(Brian Dockery LPN; 3/23/55737/13/2018 22:0210:54 AM) Gastrointestinal Present- Abdominal Pain, Bloating, Bloody Stool, Chronic diarrhea and Excessive gas. Male Genitourinary Present- Change in Urinary Stream and Frequency. Endocrine Present- Cold Intolerance. Hematology Present- Persistent Infections (diverticulitis).   Vitals Brian Maddox(Brian Dockery LPN; 5/42/70627/11/2017 3:762:32 PM) 06/20/2017 2:32 PM Weight: 212.6 lb Height: 73in Body Surface Area: 2.21 m Body Mass Index: 28.05 kg/m  Temp.: 98.17F(Oral)  Pulse: 71 (Regular)  BP: 120/72 (Sitting, Left Arm, Standard)   There were no vitals taken for this visit.     Physical Exam [name](Blayklee Mable C. Lavonne Cass MD; 06/20/2017 3:29  PM) General Mental Status-Alert. General Appearance-Not in acute distress, Not Sickly. Orientation-Oriented X3. Hydration-Well hydrated. Voice-Normal.  Integumentary Global Assessment Upon inspection  and palpation of skin surfaces of the - Axillae: non-tender, no inflammation or ulceration, no drainage. and Distribution of scalp and body hair is normal. General Characteristics Temperature - normal warmth is noted.  Head and Neck Head-normocephalic, atraumatic with no lesions or palpable masses. Face Global Assessment - atraumatic, no absence of expression. Neck Global Assessment - no abnormal movements, no bruit auscultated on the right, no bruit auscultated on the left, no decreased range of motion, non-tender. Trachea-midline. Thyroid Gland Characteristics - non-tender.  Eye Eyeball - Left-Extraocular movements intact, No Nystagmus. Eyeball - Right-Extraocular movements intact, No Nystagmus. Cornea - Left-No Hazy. Cornea - Right-No Hazy. Sclera/Conjunctiva - Left-No scleral icterus, No Discharge. Sclera/Conjunctiva - Right-No scleral icterus, No Discharge. Pupil - Left-Direct reaction to light normal. Pupil - Right-Direct reaction to light normal.  ENMT Ears Pinna - Left - no drainage observed, no generalized tenderness observed. Right - no drainage observed, no generalized tenderness observed. Nose and Sinuses External Inspection of the Nose - no destructive lesion observed. Inspection of the nares - Left - quiet respiration. Right - quiet respiration. Mouth and Throat Lips - Upper Lip - no fissures observed, no pallor noted. Lower Lip - no fissures observed, no pallor noted. Nasopharynx - no discharge present. Oral Cavity/Oropharynx - Tongue - no dryness observed. Oral Mucosa - no cyanosis observed. Hypopharynx - no evidence of airway distress observed.  Chest and Lung Exam Inspection Movements - Normal and Symmetrical. Accessory muscles -  No use of accessory muscles in breathing. Palpation Palpation of the chest reveals - Non-tender. Auscultation Breath sounds - Normal and Clear.  Cardiovascular Auscultation Rhythm - Regular. Murmurs & Other Heart Sounds - Auscultation of the heart reveals - No Murmurs and No Systolic Clicks.  Abdomen Inspection Inspection of the abdomen reveals - No Visible peristalsis and No Abnormal pulsations. Umbilicus - No Bleeding, No Urine drainage. Palpation/Percussion Palpation and Percussion of the abdomen reveal - Soft, Non Tender, No Rebound tenderness, No Rigidity (guarding) and No Cutaneous hyperesthesia. Note: abdomen soft and flat. Well-healed right groin incision from prior open inguinal hernia repair. No diastases recti. No umbilical hernia. No guarding. No specific pain. Mild discomfort left lower quadrant.   Male Genitourinary Sexual Maturity Tanner 5 - Adult hair pattern and Adult penile size and shape. Note: normal external male genitalia. No hernias.   Peripheral Vascular Upper Extremity Inspection - Left - No Cyanotic nailbeds, Not Ischemic. Right - No Cyanotic nailbeds, Not Ischemic.  Neurologic Neurologic evaluation reveals -normal attention span and ability to concentrate, able to name objects and repeat phrases. Appropriate fund of knowledge , normal sensation and normal coordination. Mental Status Affect - not angry, not paranoid. Cranial Nerves-Normal Bilaterally. Gait-Normal.  Neuropsychiatric Mental status exam performed with findings of-able to articulate well with normal speech/language, rate, volume and coherence, thought content normal with ability to perform basic computations and apply abstract reasoning and no evidence of hallucinations, delusions, obsessions or homicidal/suicidal ideation.  Musculoskeletal Global Assessment Spine, Ribs and Pelvis - no instability, subluxation or laxity. Right Upper Extremity - no instability, subluxation or  laxity.  Lymphatic Head & Neck  General Head & Neck Lymphatics: Bilateral - Description - No Localized lymphadenopathy. Axillary  General Axillary Region: Bilateral - Description - No Localized lymphadenopathy. Femoral & Inguinal  Generalized Femoral & Inguinal Lymphatics: Left - Description - No Localized lymphadenopathy. Right - Description - No Localized lymphadenopathy.    Assessment & Plan  COLOVESICAL FISTULA (N32.1) Impression: Recurrent sigmoid diverticulitits with fistula to bladder  I  think he needs sigmoid colectomy with baldder repair. He agrees. reasonable for minimally invasive robotic approach  Cipro/Flagyl PRN another flare     Current Plans Started Ciprofloxacin HCl 500MG , 1 (one) Tablet two times daily, #14, 06/20/2017, Ref. x5. The anatomy & physiology of the digestive tract was discussed. The pathophysiology of the colon was discussed. Natural history risks without surgery was discussed. I feel the risks of no intervention will lead to serious problems that outweigh the operative risks; therefore, I recommended a partial colectomy to remove the pathology. Minimally invasive (Robotic/Laparoscopic) & open techniques were discussed.  Risks such as bleeding, infection, abscess, leak, reoperation, possible ostomy, hernia, heart attack, death, and other risks were discussed. I noted a good likelihood this will help address the problem. Goals of post-operative recovery were discussed as well. Need for adequate nutrition, daily bowel regimen and healthy physical activity, to optimize recovery was noted as well. We will work to minimize complications. Educational materials were available as well. Questions were answered. The patient & his spouse express understanding & wish to proceed with surgery.   [name], M.D., F.A.C.S. Gastrointestinal and Minimally Invasive Surgery [facility] [address], [address] [place], [place]  [address] [phone] Main / Paging

## 2017-08-07 NOTE — Interval H&P Note (Signed)
History and Physical Interval Note:  08/07/2017 9:46 AM  Brian Maddox  has presented today for surgery, with the diagnosis of Colovesical fistula with sigmoid diverticulitis  The various methods of treatment have been discussed with the patient and family. After consideration of risks, benefits and other options for treatment, the patient has consented to  Procedure(s) with comments: XI ROBOT DISTAL SIGMOID COLECTOMY ERAS PATHWAY (N/A) - ERAS PATHWAY RIGID PROCTOSCOPY (N/A) as a surgical intervention .  The patient's history has been reviewed, patient examined, no change in status, stable for surgery.  I have reviewed the patient's chart and labs.  Questions were answered to the patient's satisfaction.     Darragh Nay C.

## 2017-08-08 LAB — BASIC METABOLIC PANEL
Anion gap: 7 (ref 5–15)
BUN: 11 mg/dL (ref 6–20)
CO2: 27 mmol/L (ref 22–32)
Calcium: 8.6 mg/dL — ABNORMAL LOW (ref 8.9–10.3)
Chloride: 104 mmol/L (ref 101–111)
Creatinine, Ser: 1.26 mg/dL — ABNORMAL HIGH (ref 0.61–1.24)
GFR calc Af Amer: >60 mL/min (ref >60)
GLUCOSE: 134 mg/dL — AB (ref 65–99)
POTASSIUM: 4.7 mmol/L (ref 3.5–5.1)
Sodium: 138 mmol/L (ref 135–145)

## 2017-08-08 LAB — CBC
HEMATOCRIT: 40 % (ref 39.0–52.0)
Hemoglobin: 12.8 g/dL — ABNORMAL LOW (ref 13.0–17.0)
MCH: 27.7 pg (ref 26.0–34.0)
MCHC: 32 g/dL (ref 30.0–36.0)
MCV: 86.6 fL (ref 78.0–100.0)
Platelets: 243 10*3/uL (ref 150–400)
RBC: 4.62 MIL/uL (ref 4.22–5.81)
RDW: 14.6 % (ref 11.5–15.5)
WBC: 14.3 10*3/uL — ABNORMAL HIGH (ref 4.0–10.5)

## 2017-08-08 LAB — MAGNESIUM: Magnesium: 1.9 mg/dL (ref 1.7–2.4)

## 2017-08-08 MED ORDER — METHOCARBAMOL 500 MG PO TABS: 1000.0000 mg | ORAL_TABLET | Freq: Four times a day (QID) | ORAL | Status: DC

## 2017-08-08 MED ORDER — METHOCARBAMOL 500 MG PO TABS
750.0000 mg | ORAL_TABLET | Freq: Three times a day (TID) | ORAL | Status: DC
  Administered 2017-08-08 – 2017-08-09 (×3): 750 mg via ORAL
  Filled 2017-08-08 (×3): qty 2

## 2017-08-08 NOTE — Progress Notes (Signed)
[facility]  [address] [place], [place] [address] Phone: [phone]  FAX: [place] [phone] Jun 26, 1971  CARE TEAM:  PCP: Manon Hilding, MD  Outpatient Care Team: Patient Care Team: Sasser, Silvestre Moment, MD as PCP - General (Family Medicine) Michael Boston, MD as Consulting Physician (General Surgery) Franchot Gallo, MD as Consulting Physician (Urology) Rogene Houston, MD as Consulting Physician (Gastroenterology) Butch Penny, NP as Nurse Practitioner (Internal Medicine)  Inpatient Treatment Team: Treatment Team: Attending Provider: Michael Boston, MD; Technician: Leda Quail, NT   Problem List:   Principal Problem:   Recurrent Diverticulitis s/p robotic sigmoid colectomy 08/07/2017 Active Problems:   Colovesical fistula   1 Day Post-Op  08/07/2017  POST-OPERATIVE DIAGNOSIS:     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  More than 50% of that time was spent in counseling.  I discussed operative findings, updated the patient's status, discussed probable steps to recovery, and gave postoperative recommendations to the patient and spouse.  Recommendations were made.  Questions were answered.  They expressed understanding & appreciation.   Adin Hector, M.D., F.A.C.S. Gastrointestinal and Minimally Invasive Surgery [facility] [address] [place], [place] [address] [phone] Main / Paging   08/08/2017    Subjective: (Chief  complaint)  Sore Walking Tol liquids  Objective:  Vital signs:  Vitals:   08/07/17 1800 08/07/17 2136 08/08/17 0155 08/08/17 0438  BP: 121/73 111/65 108/61 117/67  Pulse: 71 81 78 64  Resp: 16 18 18 16   Temp: 97.8 F (36.6 C) 98.3 F (36.8 C) 98.7 F (37.1 C) 98 F (36.7 C)  TempSrc: Oral Oral Oral Oral  SpO2: 100% 97% 98% 97%  Weight:    97.1 kg (214 lb 1.1 oz)  Height:        Last BM Date: 08/07/17 (small loose)  Intake/Output   Yesterday:  08/29 0701 - 08/30 0700 In: 2643.3 [P.O.:570; I.V.:2073.3] Out: 1525 [Urine:1475; Blood:50] This shift:  Total I/O In: -  Out: 150 [Urine:150]  Bowel function:  Flatus: YES  BM:  YES  Drain: (No drain)   Physical Exam:  General: Pt awake/alert/oriented x4 in no acute distress Eyes: PERRL, normal EOM.  Sclera clear.  No icterus Neuro: CN II-XII intact w/o focal sensory/motor deficits. Lymph: No head/neck/groin lymphadenopathy Psych:  No delerium/psychosis/paranoia HENT: Normocephalic, Mucus membranes moist.  No thrush Neck: Supple, No tracheal deviation Chest: No chest wall pain w good excursion CV:  Pulses intact.  Regular rhythm MS: Normal AROM mjr joints.  No obvious deformity  Abdomen: Soft.  Mildy distended.  Mildly tender at incisions only.  No evidence of peritonitis.  No incarcerated hernias.  Ext:  No deformity.  No mjr edema.  No cyanosis Skin: No petechiae / purpura  Results:   Labs: Results for orders placed or performed during the hospital encounter of 08/07/17 (from the past 48 hour(s))  Basic metabolic panel     Status: Abnormal   Collection Time: 08/08/17  5:05 AM  Result Value Ref Range   Sodium 138  135 - 145 mmol/L   Potassium 4.7 3.5 - 5.1 mmol/L   Chloride 104 101 - 111 mmol/L   CO2 27 22 - 32 mmol/L   Glucose, Bld 134 (H) 65 - 99 mg/dL   BUN 11 6 - 20 mg/dL   Creatinine, Ser 1.26 (H) 0.61 - 1.24 mg/dL   Calcium 8.6 (L) 8.9 - 10.3 mg/dL   GFR calc non Af Amer >60 >60 mL/min   GFR  calc Af Amer >60 >60 mL/min    Comment: (NOTE) The eGFR has been calculated using the CKD EPI equation. This calculation has not been validated in all clinical situations. eGFR's persistently <60 mL/min signify possible Chronic Kidney Disease.    Anion gap 7 5 - 15  CBC     Status: Abnormal   Collection Time: 08/08/17  5:05 AM  Result Value Ref Range   WBC 14.3 (H) 4.0 - 10.5 K/uL   RBC 4.62 4.22 - 5.81 MIL/uL   Hemoglobin 12.8 (L) 13.0 - 17.0 g/dL   HCT 40.0 39.0 - 52.0 %   MCV 86.6 78.0 - 100.0 fL   MCH 27.7 26.0 - 34.0 pg   MCHC 32.0 30.0 - 36.0 g/dL   RDW 14.6 11.5 - 15.5 %   Platelets 243 150 - 400 K/uL  Magnesium     Status: None   Collection Time: 08/08/17  5:05 AM  Result Value Ref Range   Magnesium 1.9 1.7 - 2.4 mg/dL    Imaging / Studies: No results found.  Medications / Allergies: per chart  Antibiotics: Anti-infectives    Start     Dose/Rate Route Frequency Ordered Stop   08/07/17 2200  cefoTEtan (CEFOTAN) 2 g in dextrose 5 % 50 mL IVPB     2 g 100 mL/hr over 30 Minutes Intravenous Every 12 hours 08/07/17 1512 08/07/17 2257   08/07/17 1306  clindamycin (CLEOCIN) 900 mg, gentamicin (GARAMYCIN) 240 mg in sodium chloride 0.9 % 1,000 mL for intraperitoneal lavage  Status:  Discontinued       As needed 08/07/17 1307 08/07/17 1331   08/07/17 0818  cefoTEtan in Dextrose 5% (CEFOTAN) IVPB 2 g     2 g Intravenous On call to O.R. 08/07/17 0818 08/07/17 1047   08/07/17 0818  neomycin (MYCIFRADIN) tablet 1,000 mg  Status:  Discontinued     1,000 mg Oral 3 times per day 08/07/17 0819 08/07/17 0824   08/07/17 0818  metroNIDAZOLE (FLAGYL) tablet 1,000 mg  Status:  Discontinued     1,000 mg Oral 3 times per day 08/07/17 0819 08/07/17 0824   08/06/17 1230  clindamycin (CLEOCIN) 900 mg, gentamicin (GARAMYCIN) 240 mg in sodium chloride 0.9 % 1,000 mL for intraperitoneal lavage  Status:  Discontinued      Intraperitoneal To Surgery 08/06/17 1222 08/07/17 1230         Note: Portions of this report may have been transcribed using voice recognition software. Every effort was made to ensure accuracy; however, inadvertent computerized transcription errors may be present.   Any transcriptional errors that result from this process are unintentional.     Adin Hector, M.D., F.A.C.S. Gastrointestinal and Minimally Invasive Surgery [facility] [address] [place], [place] [address] [phone] Main / Paging   08/08/2017

## 2017-08-08 NOTE — Progress Notes (Signed)
Pt ambulated in hallway about 8-10 times today, up in chair.  Had several loose bowel movements with blood noted in at least one of them.  Tolerating diet. Will continue to monitor.

## 2017-08-09 NOTE — Discharge Summary (Signed)
Physician Discharge Summary  Patient ID: Brian Maddox MRN: [id number] DOB/AGE: 1971/06/13  46 y.o.  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  Postoperatively, the patient gradually mobilized and advanced to a solid diet.  Pain and other symptoms were treated aggressively.    By the time of discharge, the patient was walking well the hallways, eating food, having flatus.  Pain was well-controlled on an oral medications.  Based on meeting discharge criteria and continuing to recover, I felt it was safe for the patient to be discharged from the hospital to further recover with close followup. Postoperative recommendations were discussed in detail.  They are written as well.  Discharged Condition: good  Disposition:  Follow-up Information    Michael Boston, MD. Schedule an appointment as soon as possible for a visit in 2 weeks.   Specialty:  General Surgery Why:  To follow up after your hospital stay, To follow up after your operation Contact information: [address] [place] [address] [phone]           01-Home or Self Care  Discharge Instructions    Call MD for:    Complete by:   As directed    FEVER > 101.5 F  (temperatures < 101.5 F are not significant)   Call MD for:    Complete by:  As directed    FEVER > 101.5 F  (temperatures < 101.5 F are not significant)   Call MD for:  extreme fatigue    Complete by:  As directed    Call MD for:  extreme fatigue    Complete by:  As directed    Call MD for:  persistant dizziness or light-headedness    Complete by:  As directed    Call MD for:  persistant dizziness or light-headedness    Complete by:  As directed    Call MD for:  persistant nausea and vomiting    Complete by:  As directed    Call MD for:  persistant nausea and vomiting    Complete by:  As directed    Call MD for:  redness, tenderness, or signs of infection (pain, swelling, redness, odor or green/yellow discharge around incision site)    Complete by:  As directed    Call MD for:  redness, tenderness, or signs of infection (pain, swelling, redness, odor or green/yellow discharge around incision site)    Complete by:  As directed    Call MD for:  severe uncontrolled pain    Complete by:  As directed    Call MD for:  severe uncontrolled pain    Complete by:  As directed    Diet - low sodium heart healthy    Complete by:  As directed    Follow a light diet the first few  days at home.   Start with a bland diet such as soups, liquids, starchy foods, low fat foods, etc.   If you feel full, bloated, or constipated, stay on a full liquid or pureed/blenderized diet for a few days until you feel better and no longer constipated. Be sure to drink plenty of fluids every day to avoid getting dehydrated (feeling dizzy, not urinating, etc.). Gradually add a fiber supplement to your diet   Diet - low sodium heart healthy    Complete by:  As directed    Follow a light diet the first few days at home.   Start with a bland diet such as soups, liquids, starchy foods, low fat foods, etc.   If you feel full, bloated, or constipated, stay on a full liquid or  pureed/blenderized diet for a few days until you feel better and no longer constipated. Be sure to drink plenty of fluids every day to avoid getting dehydrated (feeling dizzy, not urinating, etc.). Gradually add a fiber supplement to your diet   Discharge instructions    Complete by:  As directed    See Discharge Instructions If you are not getting better after two weeks or are noticing you are getting worse, contact our office [phone] for further advice.  We may need to adjust your medications, re-evaluate you in the office, send you to the emergency room, or see what other things we can do to help. The clinic staff is available to answer your questions during regular business hours (8:30am-5pm).  Please don't hesitate to call and ask to speak to one of our nurses for clinical concerns.    A surgeon from [facility] is always on call at the hospitals 24 hours/day If you have a medical emergency, go to the nearest emergency room or call 911.   Discharge instructions    Complete by:  As directed    See Discharge Instructions If you are not getting better after two weeks or are noticing you are getting worse, contact our office [phone] for further advice.  We may need to adjust your medications, re-evaluate you in the office, send you to the emergency room, or see what other things we can do to help. The clinic staff is available to answer your questions during regular business hours (8:30am-5pm).  Please don't hesitate to call and ask to speak to one of our nurses for clinical concerns.    A surgeon from [facility] is always on call at the hospitals 24 hours/day If you have a medical emergency, go to the nearest emergency room or call 911.   Driving Restrictions    Complete by:  As directed    You may drive when you are no longer taking narcotic prescription pain medication, you can comfortably wear a seatbelt, and you can safely make sudden turns/stops to  protect yourself without hesitating due to pain.   Driving Restrictions    Complete by:  As directed    You may drive when you are no longer taking narcotic prescription pain medication, you can comfortably wear a seatbelt, and you can safely make sudden turns/stops to protect yourself without hesitating due to pain.   Increase activity slowly    Complete by:  As directed    Start light daily activities --- self-care, walking, climbing stairs- beginning the day after surgery.  Gradually increase activities as tolerated.  Control your pain to be active.  Stop when you are tired.  Ideally, walk  several times a day, eventually an hour a day.   Most people are back to most day-to-day activities in a few weeks.  It takes 4-8 weeks to get back to unrestricted, intense activity. If you can walk 30 minutes without difficulty, it is safe to try more intense activity such as jogging, treadmill, bicycling, low-impact aerobics, swimming, etc. Save the most intensive and strenuous activity for last (Usually 4-8 weeks after surgery) such as sit-ups, heavy lifting, contact sports, etc.  Refrain from any intense heavy lifting or straining until you are off narcotics for pain control.  You will have off days, but things should improve week-by-week. DO NOT PUSH THROUGH PAIN.  Let pain be your guide: If it hurts to do something, don't do it.  Pain is your body warning you to avoid that activity for another week until the pain goes down.   Increase activity slowly    Complete by:  As directed    Start light daily activities --- self-care, walking, climbing stairs- beginning the day after surgery.  Gradually increase activities as tolerated.  Control your pain to be active.  Stop when you are tired.  Ideally, walk several times a day, eventually an hour a day.   Most people are back to most day-to-day activities in a few weeks.  It takes 4-8 weeks to get back to unrestricted, intense activity. If you can walk 30 minutes  without difficulty, it is safe to try more intense activity such as jogging, treadmill, bicycling, low-impact aerobics, swimming, etc. Save the most intensive and strenuous activity for last (Usually 4-8 weeks after surgery) such as sit-ups, heavy lifting, contact sports, etc.  Refrain from any intense heavy lifting or straining until you are off narcotics for pain control.  You will have off days, but things should improve week-by-week. DO NOT PUSH THROUGH PAIN.  Let pain be your guide: If it hurts to do something, don't do it.  Pain is your body warning you to avoid that activity for another week until the pain goes down.   Lifting restrictions    Complete by:  As directed    If you can walk 30 minutes without difficulty, it is safe to try more intense activity such as jogging, treadmill, bicycling, low-impact aerobics, swimming, etc. Save the most intensive and strenuous activity for last (Usually 4-8 weeks after surgery) such as sit-ups, heavy lifting, contact sports, etc.  Refrain from any intense heavy lifting or straining until you are off narcotics for pain control.  You will have off days, but things should improve week-by-week. DO NOT PUSH THROUGH PAIN.  Let pain be your guide: If it hurts to do something, don't do it.  Pain is your body warning you to avoid that activity for another week until the pain goes down.   Lifting restrictions    Complete by:  As directed    If you can walk 30 minutes without difficulty, it is safe to try more intense activity such as jogging, treadmill, bicycling, low-impact aerobics, swimming, etc. Save the most intensive and strenuous activity for last (Usually 4-8 weeks after surgery) such as sit-ups, heavy lifting, contact sports, etc.  Refrain from any intense heavy lifting or straining until you are off narcotics for pain control.  You will have off days, but things should improve week-by-week. DO NOT PUSH THROUGH PAIN.  Let pain be your guide: If it hurts to  do something, don't do it.  Pain is your body warning you to avoid  that activity for another week until the pain goes down.   May walk up steps    Complete by:  As directed    May walk up steps    Complete by:  As directed    No wound care    Complete by:  As directed    It is good for closed incision and even open wounds to be washed every day.  Shower every day.  Short baths are fine.  Wash the incisions and wounds clean with soap & water.    If you have a closed incision(s), wash the incision with soap & water every day.  You may leave closed incisions open to air if it is dry.   You may cover the incision with clean gauze & replace it after your daily shower for comfort. If you have skin tapes (Steristrips) or skin glue (Dermabond) on your incision, leave them in place.  They will fall off on their own like a scab.  You may trim any edges that curl up with clean scissors.  If you have staples, set up an appointment for them to be removed in the office in 10 days after surgery.  If you have a drain, wash around the skin exit site with soap & water and place a new dressing of gauze or band aid around the skin every day.  Keep the drain site clean & dry.   No wound care    Complete by:  As directed    It is good for closed incision and even open wounds to be washed every day.  Shower every day.  Short baths are fine.  Wash the incisions and wounds clean with soap & water.    If you have a closed incision(s), wash the incision with soap & water every day.  You may leave closed incisions open to air if it is dry.   You may cover the incision with clean gauze & replace it after your daily shower for comfort. If you have skin tapes (Steristrips) or skin glue (Dermabond) on your incision, leave them in place.  They will fall off on their own like a scab.  You may trim any edges that curl up with clean scissors.  If you have staples, set up an appointment for them to be removed in the office in 10 days  after surgery.  If you have a drain, wash around the skin exit site with soap & water and place a new dressing of gauze or band aid around the skin every day.  Keep the drain site clean & dry.   Remove dressing in 24 hours    Complete by:  As directed    Remove all dressings on abdomen Saturday AM, including the "shoestring" wicks in the lower largest incision   Sexual Activity Restrictions    Complete by:  As directed    You may have sexual intercourse when it is comfortable. If it hurts to do something, stop.   Sexual Activity Restrictions    Complete by:  As directed    You may have sexual intercourse when it is comfortable. If it hurts to do something, stop.      Allergies as of 08/09/2017   No Known Allergies     Medication List    STOP taking these medications   ciprofloxacin 500 MG tablet Commonly known as:  CIPRO   metroNIDAZOLE 500 MG tablet Commonly known as:  FLAGYL   neomycin 500 MG tablet Commonly known  as:  MYCIFRADIN     TAKE these medications   acetaminophen 500 MG tablet Commonly known as:  TYLENOL Take 1,000 mg by mouth every 6 (six) hours as needed (for pain/headaches.).   traMADol 50 MG tablet Commonly known as:  ULTRAM Take 1-2 tablets (50-100 mg total) by mouth every 6 (six) hours as needed for moderate pain or severe pain.            Discharge Care Instructions        Start     Ordered   08/09/17 0000  Discharge instructions    Comments:  See Discharge Instructions If you are not getting better after two weeks or are noticing you are getting worse, contact our office [phone] for further advice.  We may need to adjust your medications, re-evaluate you in the office, send you to the emergency room, or see what other things we can do to help. The clinic staff is available to answer your questions during regular business hours (8:30am-5pm).  Please don't hesitate to call and ask to speak to one of our nurses for clinical concerns.    A  surgeon from [facility] is always on call at the hospitals 24 hours/day If you have a medical emergency, go to the nearest emergency room or call 911.   08/09/17 0745   08/09/17 0000  Diet - low sodium heart healthy    Comments:  Follow a light diet the first few days at home.   Start with a bland diet such as soups, liquids, starchy foods, low fat foods, etc.   If you feel full, bloated, or constipated, stay on a full liquid or pureed/blenderized diet for a few days until you feel better and no longer constipated. Be sure to drink plenty of fluids every day to avoid getting dehydrated (feeling dizzy, not urinating, etc.). Gradually add a fiber supplement to your diet   08/09/17 0745   08/09/17 0000  Increase activity slowly    Comments:  Start light daily activities --- self-care, walking, climbing stairs- beginning the day after surgery.  Gradually increase activities as tolerated.  Control your pain to be active.  Stop when you are tired.  Ideally, walk several times a day, eventually an hour a day.   Most people are back to most day-to-day activities in a few weeks.  It takes 4-8 weeks to get back to unrestricted, intense activity. If you can walk 30 minutes without difficulty, it is safe to try more intense activity such as jogging, treadmill, bicycling, low-impact aerobics, swimming, etc. Save the most intensive and strenuous activity for last (Usually 4-8 weeks after surgery) such as sit-ups, heavy lifting, contact sports, etc.  Refrain from any intense heavy lifting or straining until you are off narcotics for pain control.  You will have off days, but things should improve week-by-week. DO NOT PUSH THROUGH PAIN.  Let pain be your guide: If it hurts to do something, don't do it.  Pain is your body warning you to avoid that activity for another week until the pain goes down.   08/09/17 0745   08/09/17 0000  May walk up steps     08/09/17 0745   08/09/17 0000  Driving  Restrictions    Comments:  You may drive when you are no longer taking narcotic prescription pain medication, you can comfortably wear a seatbelt, and you can safely make sudden turns/stops to protect yourself without hesitating due to pain.   08/09/17 0745  08/09/17 0000  Lifting restrictions    Comments:  If you can walk 30 minutes without difficulty, it is safe to try more intense activity such as jogging, treadmill, bicycling, low-impact aerobics, swimming, etc. Save the most intensive and strenuous activity for last (Usually 4-8 weeks after surgery) such as sit-ups, heavy lifting, contact sports, etc.  Refrain from any intense heavy lifting or straining until you are off narcotics for pain control.  You will have off days, but things should improve week-by-week. DO NOT PUSH THROUGH PAIN.  Let pain be your guide: If it hurts to do something, don't do it.  Pain is your body warning you to avoid that activity for another week until the pain goes down.   08/09/17 0745   08/09/17 0000  Sexual Activity Restrictions    Comments:  You may have sexual intercourse when it is comfortable. If it hurts to do something, stop.   08/09/17 0745   08/09/17 0000  No wound care    Comments:  It is good for closed incision and even open wounds to be washed every day.  Shower every day.  Short baths are fine.  Wash the incisions and wounds clean with soap & water.    If you have a closed incision(s), wash the incision with soap & water every day.  You may leave closed incisions open to air if it is dry.   You may cover the incision with clean gauze & replace it after your daily shower for comfort. If you have skin tapes (Steristrips) or skin glue (Dermabond) on your incision, leave them in place.  They will fall off on their own like a scab.  You may trim any edges that curl up with clean scissors.  If you have staples, set up an appointment for them to be removed in the office in 10 days after surgery.  If you have  a drain, wash around the skin exit site with soap & water and place a new dressing of gauze or band aid around the skin every day.  Keep the drain site clean & dry.   08/09/17 0745   08/09/17 0000  Call MD for:    Comments:  FEVER > 101.5 F  (temperatures < 101.5 F are not significant)   08/09/17 0745   08/09/17 0000  Call MD for:  persistant nausea and vomiting     08/09/17 0745   08/09/17 0000  Call MD for:  severe uncontrolled pain     08/09/17 0745   08/09/17 0000  Call MD for:  redness, tenderness, or signs of infection (pain, swelling, redness, odor or green/yellow discharge around incision site)     08/09/17 0745   08/09/17 0000  Call MD for:  persistant dizziness or light-headedness     08/09/17 0745   08/09/17 0000  Call MD for:  extreme fatigue     08/09/17 0745   08/09/17 0000  Remove dressing in 24 hours    Comments:  Remove all dressings on abdomen Saturday AM, including the "shoestring" wicks in the lower largest incision   08/09/17 0745   08/07/17 0000  Discharge instructions    Comments:  See Discharge Instructions If you are not getting better after two weeks or are noticing you are getting worse, contact our office [phone] for further advice.  We may need to adjust your medications, re-evaluate you in the office, send you to the emergency room, or see what other things we can do to  help. The clinic staff is available to answer your questions during regular business hours (8:30am-5pm).  Please don't hesitate to call and ask to speak to one of our nurses for clinical concerns.    A surgeon from [facility] is always on call at the hospitals 24 hours/day If you have a medical emergency, go to the nearest emergency room or call 911.   08/07/17 0917   08/07/17 0000  Diet - low sodium heart healthy    Comments:  Follow a light diet the first few days at home.   Start with a bland diet such as soups, liquids, starchy foods, low fat foods, etc.   If you feel  full, bloated, or constipated, stay on a full liquid or pureed/blenderized diet for a few days until you feel better and no longer constipated. Be sure to drink plenty of fluids every day to avoid getting dehydrated (feeling dizzy, not urinating, etc.). Gradually add a fiber supplement to your diet   08/07/17 0917   08/07/17 0000  Increase activity slowly    Comments:  Start light daily activities --- self-care, walking, climbing stairs- beginning the day after surgery.  Gradually increase activities as tolerated.  Control your pain to be active.  Stop when you are tired.  Ideally, walk several times a day, eventually an hour a day.   Most people are back to most day-to-day activities in a few weeks.  It takes 4-8 weeks to get back to unrestricted, intense activity. If you can walk 30 minutes without difficulty, it is safe to try more intense activity such as jogging, treadmill, bicycling, low-impact aerobics, swimming, etc. Save the most intensive and strenuous activity for last (Usually 4-8 weeks after surgery) such as sit-ups, heavy lifting, contact sports, etc.  Refrain from any intense heavy lifting or straining until you are off narcotics for pain control.  You will have off days, but things should improve week-by-week. DO NOT PUSH THROUGH PAIN.  Let pain be your guide: If it hurts to do something, don't do it.  Pain is your body warning you to avoid that activity for another week until the pain goes down.   08/07/17 0917   08/07/17 0000  May walk up steps     08/07/17 0917   08/07/17 0000  Driving Restrictions    Comments:  You may drive when you are no longer taking narcotic prescription pain medication, you can comfortably wear a seatbelt, and you can safely make sudden turns/stops to protect yourself without hesitating due to pain.   08/07/17 0917   08/07/17 0000  Lifting restrictions    Comments:  If you can walk 30 minutes without difficulty, it is safe to try more intense activity such  as jogging, treadmill, bicycling, low-impact aerobics, swimming, etc. Save the most intensive and strenuous activity for last (Usually 4-8 weeks after surgery) such as sit-ups, heavy lifting, contact sports, etc.  Refrain from any intense heavy lifting or straining until you are off narcotics for pain control.  You will have off days, but things should improve week-by-week. DO NOT PUSH THROUGH PAIN.  Let pain be your guide: If it hurts to do something, don't do it.  Pain is your body warning you to avoid that activity for another week until the pain goes down.   08/07/17 0917   08/07/17 0000  Sexual Activity Restrictions    Comments:  You may have sexual intercourse when it is comfortable. If it hurts to do something, stop.  08/07/17 0917   08/07/17 0000  No wound care    Comments:  It is good for closed incision and even open wounds to be washed every day.  Shower every day.  Short baths are fine.  Wash the incisions and wounds clean with soap & water.    If you have a closed incision(s), wash the incision with soap & water every day.  You may leave closed incisions open to air if it is dry.   You may cover the incision with clean gauze & replace it after your daily shower for comfort. If you have skin tapes (Steristrips) or skin glue (Dermabond) on your incision, leave them in place.  They will fall off on their own like a scab.  You may trim any edges that curl up with clean scissors.  If you have staples, set up an appointment for them to be removed in the office in 10 days after surgery.  If you have a drain, wash around the skin exit site with soap & water and place a new dressing of gauze or band aid around the skin every day.  Keep the drain site clean & dry.   08/07/17 0917   08/07/17 0000  Call MD for:    Comments:  FEVER > 101.5 F  (temperatures < 101.5 F are not significant)   08/07/17 0917   08/07/17 0000  Call MD for:  persistant nausea and vomiting     08/07/17 0917   08/07/17 0000   Call MD for:  severe uncontrolled pain     08/07/17 0917   08/07/17 0000  Call MD for:  redness, tenderness, or signs of infection (pain, swelling, redness, odor or green/yellow discharge around incision site)     08/07/17 0917   08/07/17 0000  Call MD for:  persistant dizziness or light-headedness     08/07/17 0917   08/07/17 0000  Call MD for:  extreme fatigue     08/07/17 0917   08/07/17 0000  traMADol (ULTRAM) 50 MG tablet  Every 6 hours PRN     08/07/17 0918      Significant Diagnostic Studies:  Results for orders placed or performed during the hospital encounter of 08/07/17 (from the past 72 hour(s))  Basic metabolic panel     Status: Abnormal   Collection Time: 08/08/17  5:05 AM  Result Value Ref Range   Sodium 138 135 - 145 mmol/L   Potassium 4.7 3.5 - 5.1 mmol/L   Chloride 104 101 - 111 mmol/L   CO2 27 22 - 32 mmol/L   Glucose, Bld 134 (H) 65 - 99 mg/dL   BUN 11 6 - 20 mg/dL   Creatinine, Ser 1.26 (H) 0.61 - 1.24 mg/dL   Calcium 8.6 (L) 8.9 - 10.3 mg/dL   GFR calc non Af Amer >60 >60 mL/min   GFR calc Af Amer >60 >60 mL/min    Comment: (NOTE) The eGFR has been calculated using the CKD EPI equation. This calculation has not been validated in all clinical situations. eGFR's persistently <60 mL/min signify possible Chronic Kidney Disease.    Anion gap 7 5 - 15  CBC     Status: Abnormal   Collection Time: 08/08/17  5:05 AM  Result Value Ref Range   WBC 14.3 (H) 4.0 - 10.5 K/uL   RBC 4.62 4.22 - 5.81 MIL/uL   Hemoglobin 12.8 (L) 13.0 - 17.0 g/dL   HCT 40.0 39.0 - 52.0 %   MCV 86.6 78.0 -  100.0 fL   MCH 27.7 26.0 - 34.0 pg   MCHC 32.0 30.0 - 36.0 g/dL   RDW 14.6 11.5 - 15.5 %   Platelets 243 150 - 400 K/uL  Magnesium     Status: None   Collection Time: 08/08/17  5:05 AM  Result Value Ref Range   Magnesium 1.9 1.7 - 2.4 mg/dL    No results found.  Discharge Exam: Blood pressure 129/83, pulse 71, temperature 99.1 F (37.3 C), temperature source Oral, resp.  rate 16, height 6' 1" (1.854 m), weight 97 kg (213 lb 13.5 oz), SpO2 98 %.  General: Pt awake/alert/oriented x4 in No acute distress Eyes: PERRL, normal EOM.  Sclera clear.  No icterus Neuro: CN II-XII intact w/o focal sensory/motor deficits. Lymph: No head/neck/groin lymphadenopathy Psych:  No delerium/psychosis/paranoia HENT: Normocephalic, Mucus membranes moist.  No thrush Neck: Supple, No tracheal deviation Chest: No chest wall pain w good excursion CV:  Pulses intact.  Regular rhythm MS: Normal AROM mjr joints.  No obvious deformity Abdomen: Soft.  Nondistended.  Mildly tender at incisions only.  No evidence of peritonitis.  No incarcerated hernias. Ext:  SCDs BLE.  No mjr edema.  No cyanosis Skin: No petechiae / purpura  Past Medical History:  Diagnosis Date  . Complication of anesthesia    Difficult urinating after procedure  . Diverticulitis of colon 05/02/2017    Past Surgical History:  Procedure Laterality Date  . COLONOSCOPY N/A 05/22/2017   Procedure: COLONOSCOPY;  Surgeon: Rogene Houston, MD;  [facility];  Service: Endoscopy;  Laterality: N/A;  2:30  . HERNIA REPAIR     age 61 months  . IR FIBRIN GLUE REPAIR ANAL FISTULA     last year  . kidney reflux surgery     46 months old  . NASAL FRACTURE SURGERY     fx nose  . PROCTOSCOPY N/A 08/07/2017   Procedure: RIGID PROCTOSCOPY;  Surgeon: Michael Boston, MD;  Location: [facility];  Service: General;  Laterality: N/A;  . three knee sugeries     two ACL/MCL repair. 3rd was scoped    Social History   Social History  . Marital status: Married    Spouse name: N/A  . Number of children: N/A  . Years of education: N/A   Occupational History  . Not on file.   Social History Main Topics  . Smoking status: Never Smoker  . Smokeless tobacco: Never Used  . Alcohol use Yes     Comment: rarely  . Drug use: No  . Sexual activity: Not on file   Other Topics Concern  . Not on file   Social History  Narrative  . No narrative on file    Family History  Problem Relation Age of Onset  . Non-Hodgkin's lymphoma Mother   . Sarcoidosis Father   . Coronary artery disease Father     Current Facility-Administered Medications  Medication Dose Route Frequency Provider Last Rate Last Dose  . acetaminophen (TYLENOL) tablet 1,000 mg  1,000 mg Oral TID Michael Boston, MD   1,000 mg at 08/08/17 2144  . alum & mag hydroxide-simeth (MAALOX/MYLANTA) 200-200-20 MG/5ML suspension 30 mL  30 mL Oral Q6H PRN Michael Boston, MD      . alvimopan (ENTEREG) capsule 12 mg  12 mg Oral BID Michael Boston, MD   12 mg at 08/08/17 [phone]  . diphenhydrAMINE (BENADRYL) 12.5 MG/5ML elixir 12.5 mg  12.5 mg Oral Q6H PRN Michael Boston, MD  Or  . diphenhydrAMINE (BENADRYL) injection 12.5 mg  12.5 mg Intravenous Q6H PRN Michael Boston, MD      . enoxaparin (LOVENOX) injection 40 mg  40 mg Subcutaneous Q24H Michael Boston, MD   40 mg at 08/08/17 0835  . feeding supplement (ENSURE SURGERY) liquid 237 mL  237 mL Oral BID BM Michael Boston, MD   237 mL at 08/08/17 1330  . gabapentin (NEURONTIN) capsule 300 mg  300 mg Oral BID Michael Boston, MD   300 mg at 08/08/17 2144  . guaiFENesin-dextromethorphan (ROBITUSSIN DM) 100-10 MG/5ML syrup 10 mL  10 mL Oral Q4H PRN Michael Boston, MD      . hydrocortisone (ANUSOL-HC) 2.5 % rectal cream 1 application  1 application Topical QID PRN Michael Boston, MD      . hydrocortisone cream 1 % 1 application  1 application Topical TID PRN Michael Boston, MD      . HYDROmorphone (DILAUDID) injection 0.5-2 mg  0.5-2 mg Intravenous Q2H PRN Michael Boston, MD   2 mg at 08/08/17 1954  . lactated ringers infusion 1,000 mL  1,000 mL Intravenous Q8H PRN Michael Boston, MD      . lip balm (CARMEX) ointment 1 application  1 application Topical BID Michael Boston, MD   1 application at 81/01/75 2200  . magic mouthwash  15 mL Oral QID PRN Michael Boston, MD      . menthol-cetylpyridinium (CEPACOL) lozenge 3 mg  1  lozenge Oral PRN Michael Boston, MD      . methocarbamol (ROBAXIN) tablet 750 mg  750 mg Oral TID Michael Boston, MD   750 mg at 08/08/17 2144  . metoprolol tartrate (LOPRESSOR) injection 5 mg  5 mg Intravenous Q6H PRN Michael Boston, MD      . ondansetron [facility]) tablet 4 mg  4 mg Oral Q6H PRN Michael Boston, MD       Or  . ondansetron (ZOFRAN) injection 4 mg  4 mg Intravenous Q6H PRN Michael Boston, MD      . phenol (CHLORASEPTIC) mouth spray 1-2 spray  1-2 spray Mouth/Throat PRN Michael Boston, MD      . prochlorperazine (COMPAZINE) injection 5-10 mg  5-10 mg Intravenous Q4H PRN Michael Boston, MD      . saccharomyces boulardii (FLORASTOR) capsule 250 mg  250 mg Oral BID Michael Boston, MD   250 mg at 08/08/17 2144  . zolpidem (AMBIEN) tablet 5 mg  5 mg Oral QHS PRN,MR X 1 Michael Boston, MD         No Known Allergies  Signed: Morton Peters, M.D., F.A.C.S. Gastrointestinal and [facility]imally Invasive [facility] [address], [place], [place] [address] [phone] Main / Paging   08/09/2017, 7:45 AM

## 2017-08-20 NOTE — Progress Notes (Signed)
Patient had sigmoid colon resection for recurrent diverticulitis. Path results noted.

## 2017-09-02 ENCOUNTER — Ambulatory Visit
Admission: RE | Admit: 2017-09-02 | Discharge: 2017-09-02 | Disposition: A | Discharge status: Home / Self Care | Payer: BLUE CROSS/BLUE SHIELD | Source: Ambulatory Visit | Attending: Surgery | Admitting: Surgery

## 2017-09-02 ENCOUNTER — Other Ambulatory Visit: Payer: Self-pay | Admitting: Surgery

## 2017-09-02 DIAGNOSIS — R109 Unspecified abdominal pain: Secondary | ICD-10-CM

## 2017-09-02 MED ORDER — IOPAMIDOL (ISOVUE-300) INJECTION 61%
125.0000 mL | Freq: Once | INTRAVENOUS | Status: AC | PRN
  Administered 2017-09-02: 125 mL via INTRAVENOUS

## 2019-04-07 IMAGING — CT CT ABD-PELV W/ CM
2 of 5 series · 15 of 46 positions shown, 17 images · IV contrast (Isovue)
Comparison: None.

CLINICAL DATA: Abdominal pain for 2 weeks.

EXAM:
CT ABDOMEN AND PELVIS WITH CONTRAST
TECHNIQUE: Multidetector CT imaging of the abdomen and pelvis was performed
using the standard protocol following bolus administration of
intravenous contrast.
CONTRAST:  100mL 0S0NRG-8BB IOPAMIDOL (0S0NRG-8BB) INJECTION 61%

[Series 2: axial st · axial · 0.77mm/px · z∈[+977,+1427]mm · 12 of 102 slices shown, 14 images]
[im 6/102  soft-tissue]
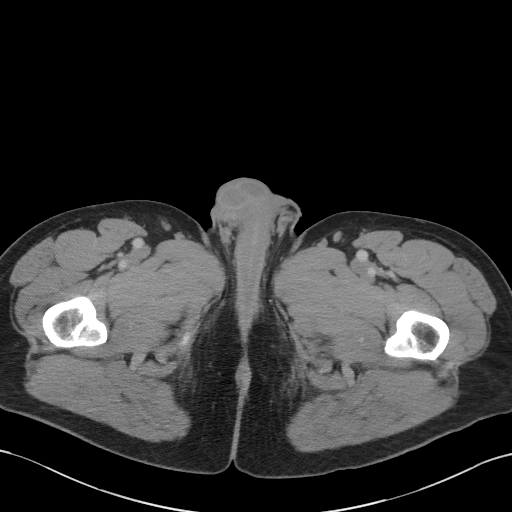
[im 6/102  bone]
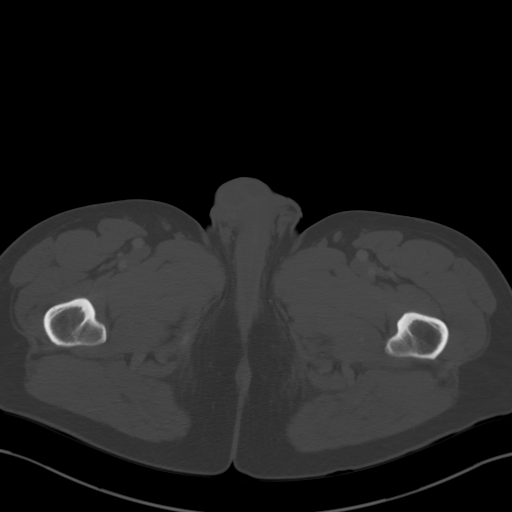
[im 16/102  soft-tissue]
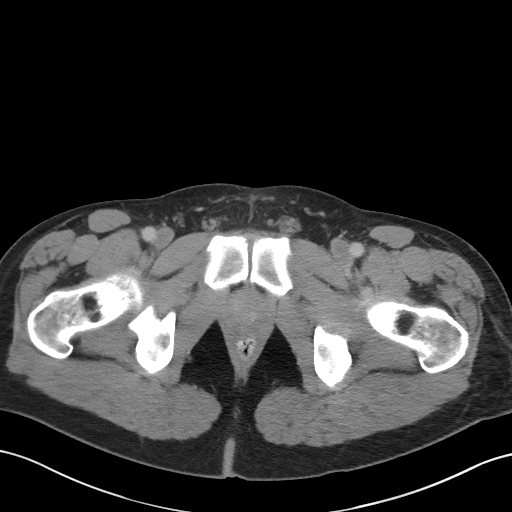
[im 22/102  soft-tissue]
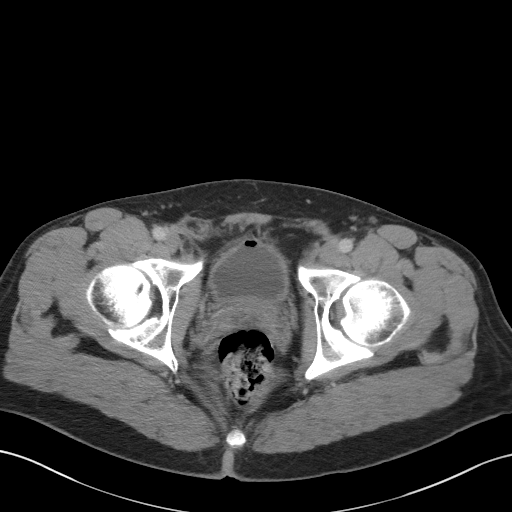
[im 32/102  soft-tissue]
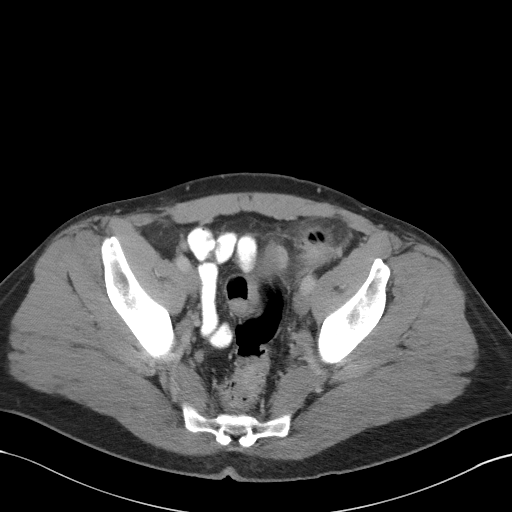
[im 38/102  soft-tissue]
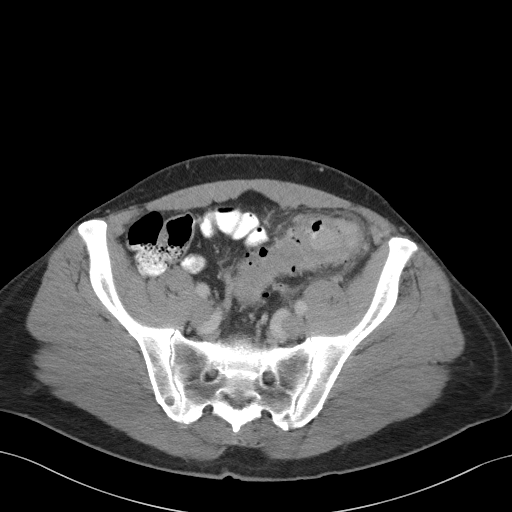
[im 48/102  soft-tissue]
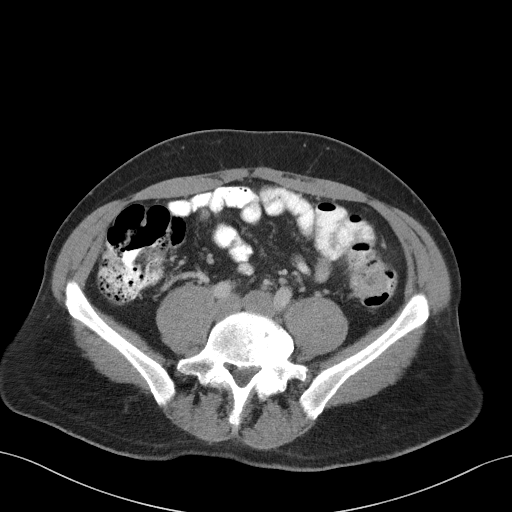
[im 54/102  soft-tissue]
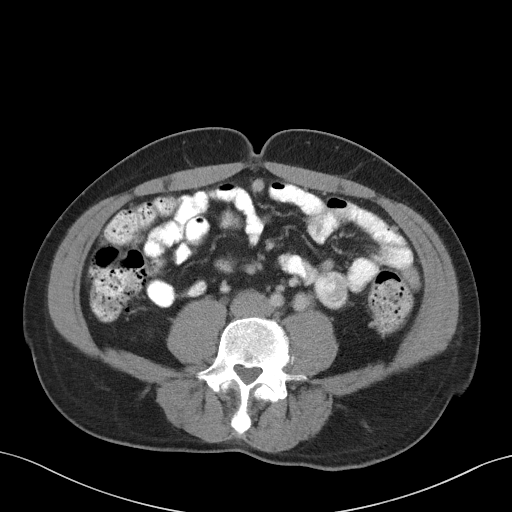
[im 64/102  soft-tissue]
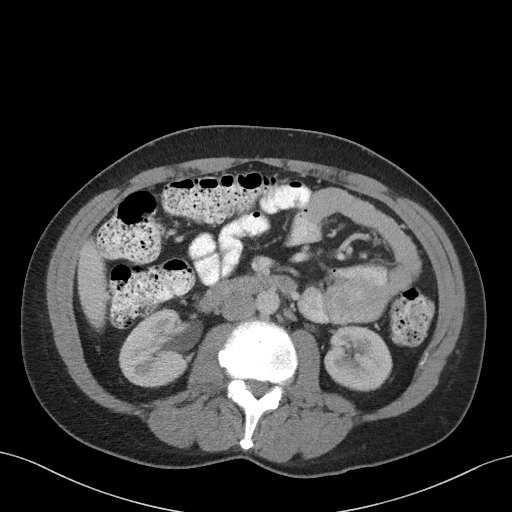
[im 70/102  soft-tissue]
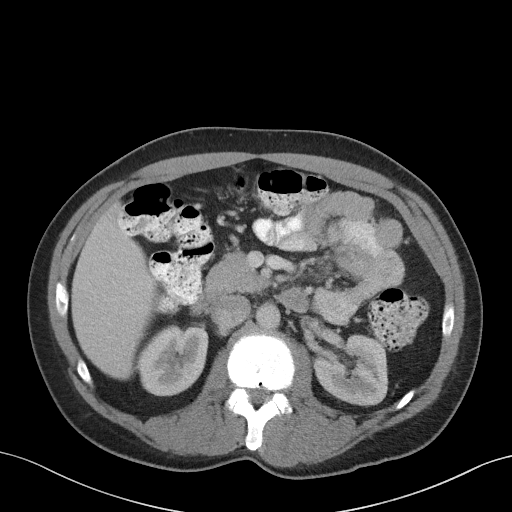
[im 70/102  bone]
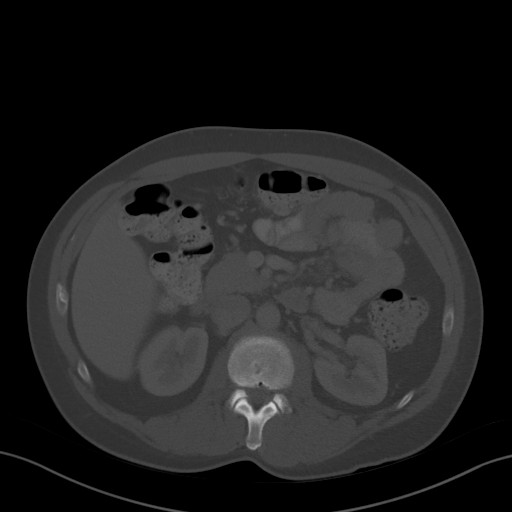
[im 80/102  soft-tissue]
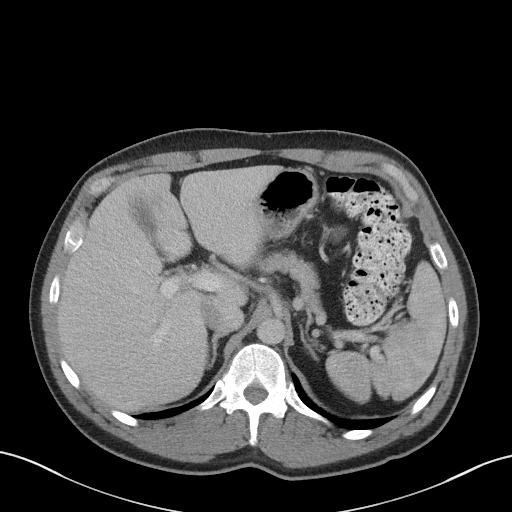
[im 86/102  soft-tissue]
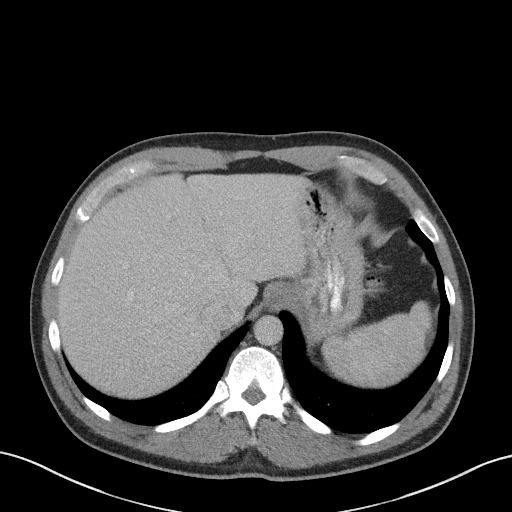
[im 96/102  soft-tissue]
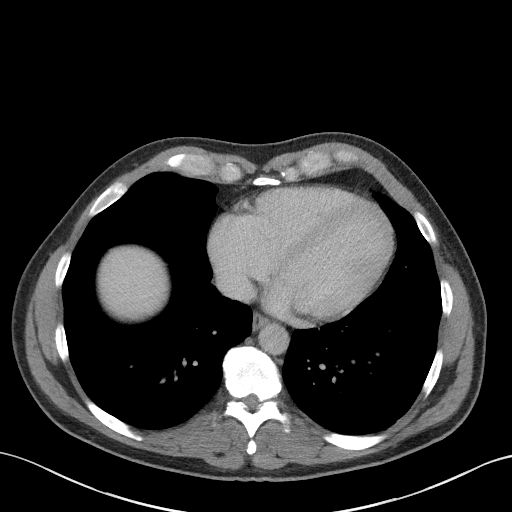

[Series 4: coronal st · coronal · 0.72mm/px · 3 of 91 slices shown]
[im 31/91  soft-tissue]
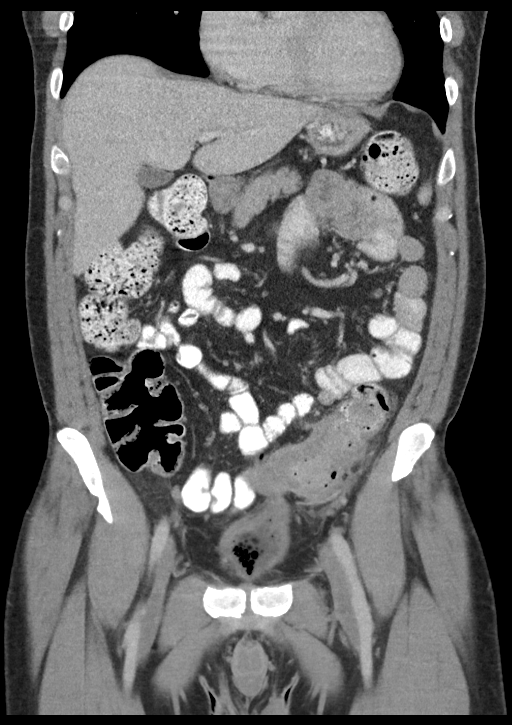
[im 41/91  soft-tissue]
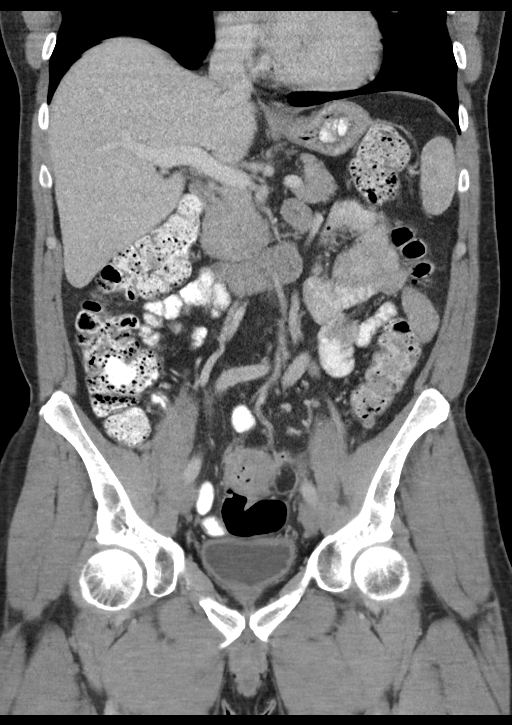
[im 51/91  soft-tissue]
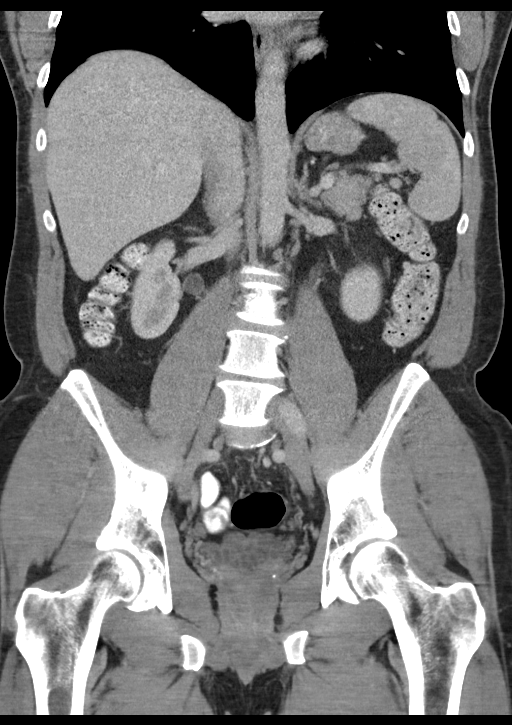

[15 of 46 positions shown; findings below may reference images not displayed]

FINDINGS: Lower chest: Lung bases show no acute findings. Heart size normal.
No pericardial or pleural effusion.

Hepatobiliary: Liver and gallbladder are unremarkable. No biliary
ductal dilatation.

Pancreas: Negative.

Spleen: Negative.

Adrenals/Urinary Tract: Adrenal glands and right kidney are
unremarkable. Subcentimeter low-attenuation lesion in the left
kidney is too small to characterize but statistically, a cyst is
most likely. Ureters are decompressed. Air is seen in the bladder.
Superior bladder wall is slightly thickened and contiguous with the
inferior wall of the sigmoid colon. Additional detail below. These
results will be called to the ordering clinician or representative
by the Radiologist Assistant, and communication documented in the
PACS or zVision Dashboard.

Stomach/Bowel: Stomach, small bowel and appendix are unremarkable.
Stool is seen in the majority of the colon. There is wall thickening
and pericolonic stranding involving the sigmoid colon. Possible
intussusception in association (series 2, images 64-66 and coronal
images 29-31). Soft tissue tract is seen from the inferior margin of
the sigmoid colon to the bladder. Remainder of the rectosigmoid
colon is within normal limits.

Vascular/Lymphatic: Vascular structures are unremarkable. Pericolic
lymph nodes adjacent to the sigmoid colon are subcentimeter in short
axis size and likely reactive.

Reproductive: Prostate is visualized.

Other: No free fluid. Mesenteries peritoneum are otherwise
unremarkable. Tiny periumbilical hernia contains fat.

Musculoskeletal: No worrisome lytic or sclerotic lesions. Mild
degenerative changes in the spine.
IMPRESSION: Sigmoid diverticulitis with a colovesical fistula. Suspect an
associated sigmoid intussusception. A lead point mass cannot be
excluded. Follow-up CT abdomen pelvis with contrast or colonoscopy
is recommended after appropriate treatment, to exclude underlying
malignancy.

## 2019-08-18 IMAGING — CT CT ABD-PELV W/ CM
1 of 3 series · 13 of 32 positions shown, 18 images · IV contrast (APPLIED)
Comparison: April 23, 2017

CLINICAL DATA: Abdominal pain

EXAM:
CT ABDOMEN AND PELVIS WITH CONTRAST
TECHNIQUE: Multidetector CT imaging of the abdomen and pelvis was performed
using the standard protocol following bolus administration of
intravenous contrast.
CONTRAST:  125mL PRHIN3-2PP IOPAMIDOL (PRHIN3-2PP) INJECTION 61%

[Series 2: abd/pelvis w/cm · axial · 0.78mm/px · z∈[-508,-83]mm · 13 of 97 slices shown, 18 images]
[im 6/97  soft-tissue]
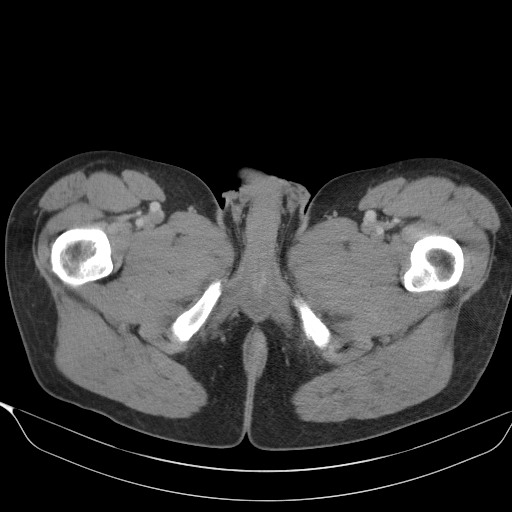
[im 6/97  bone]
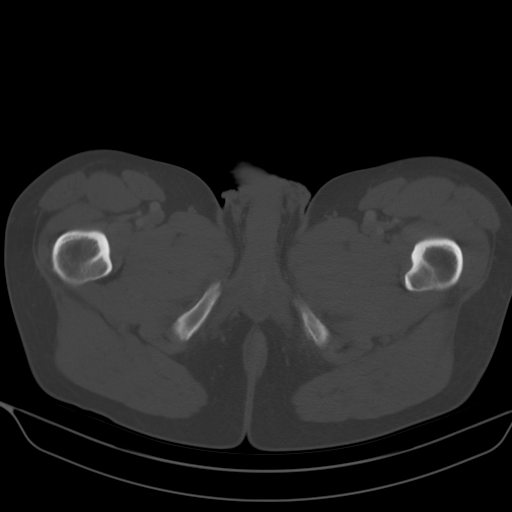
[im 16/97  soft-tissue]
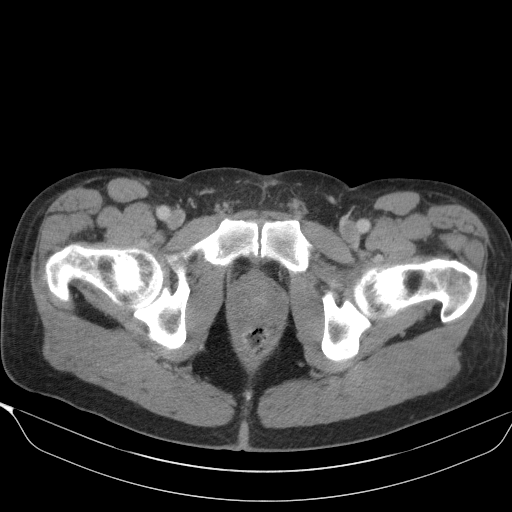
[im 21/97  soft-tissue]
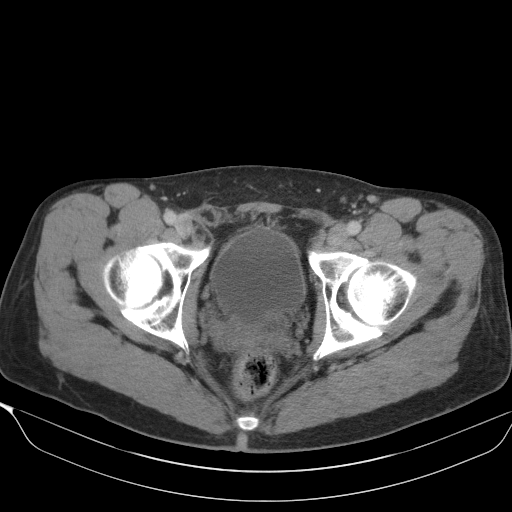
[im 31/97  soft-tissue]
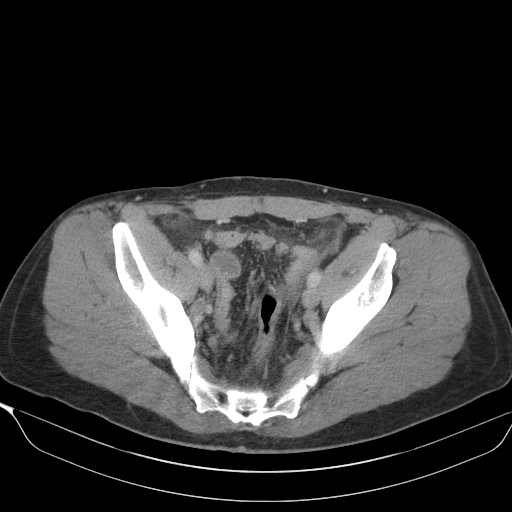
[im 36/97  soft-tissue]
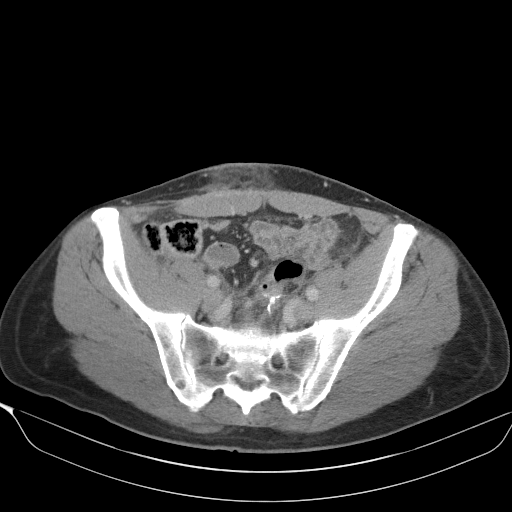
[im 46/97  soft-tissue]
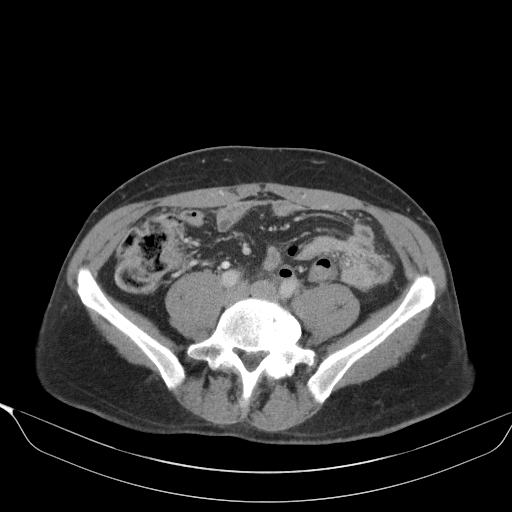
[im 51/97  soft-tissue]
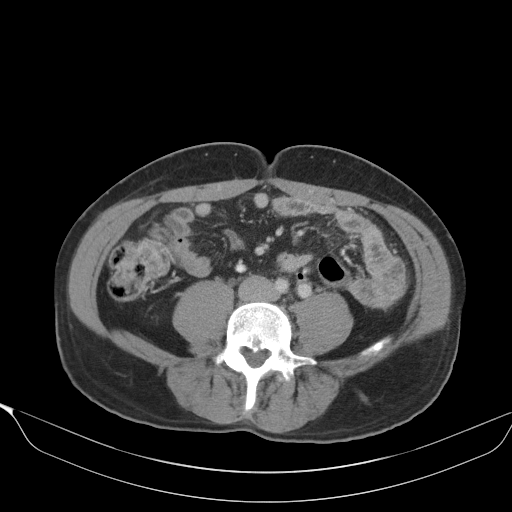
[im 61/97  soft-tissue]
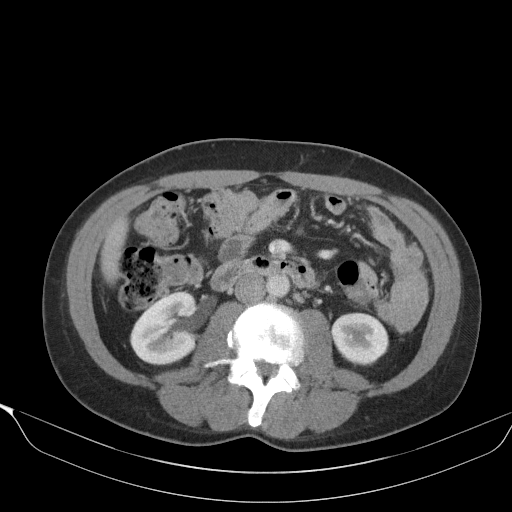
[im 66/97  soft-tissue]
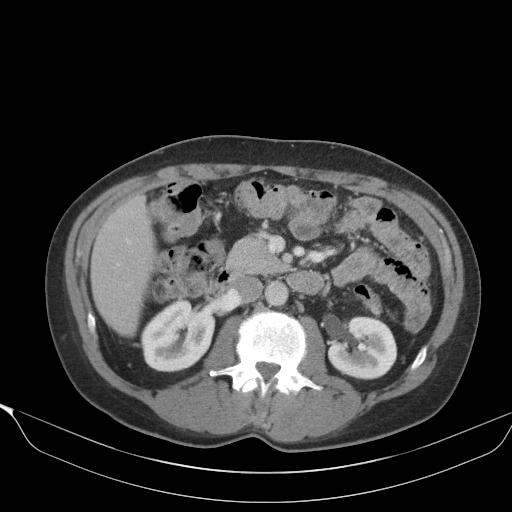
[im 66/97  bone]
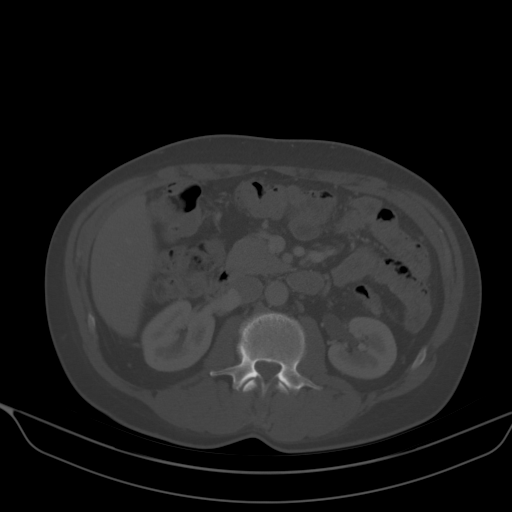
[im 76/97  soft-tissue]
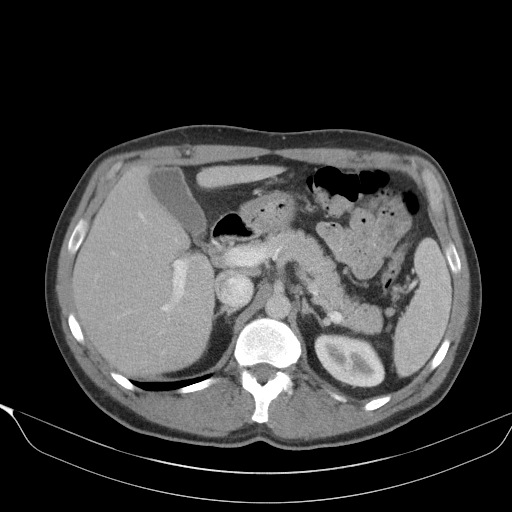
[im 76/97  lung]
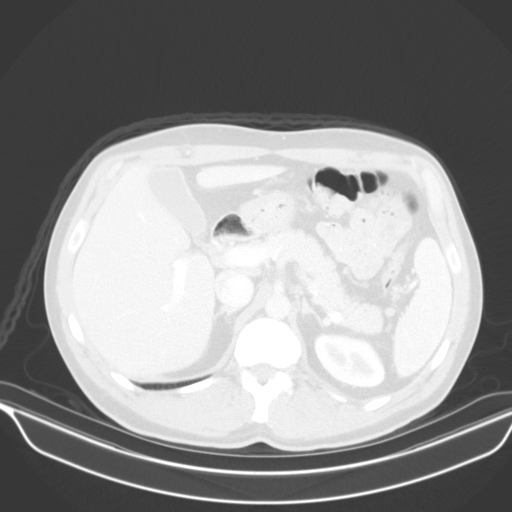
[im 81/97  soft-tissue]
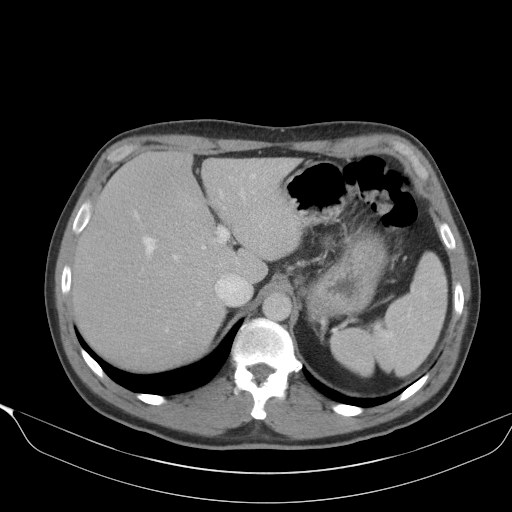
[im 81/97  lung]
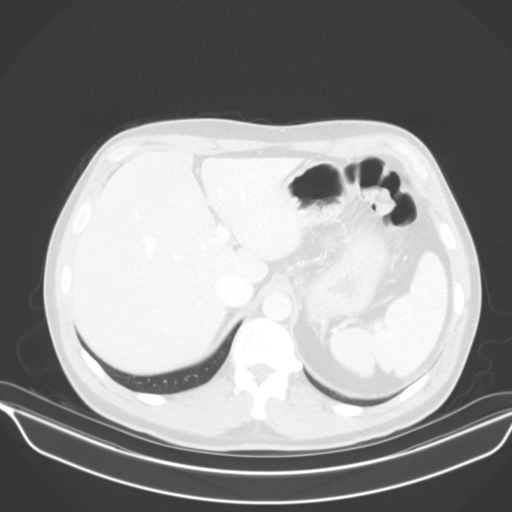
[im 86/97  lung]
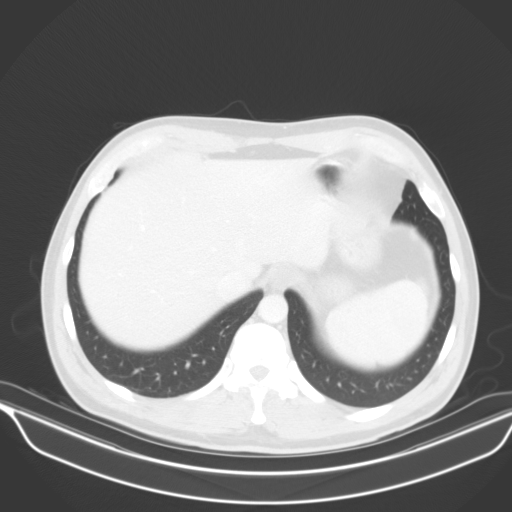
[im 91/97  soft-tissue]
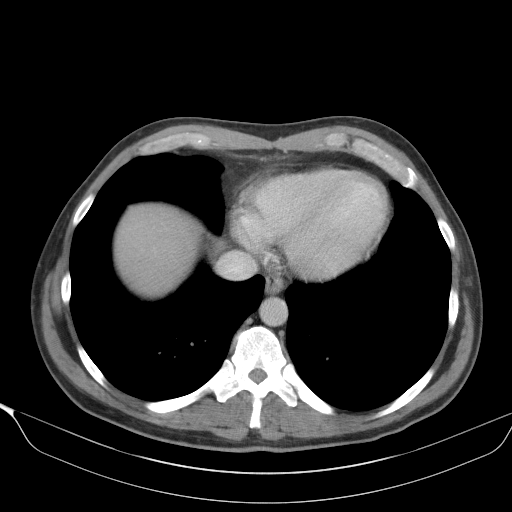
[im 91/97  lung]
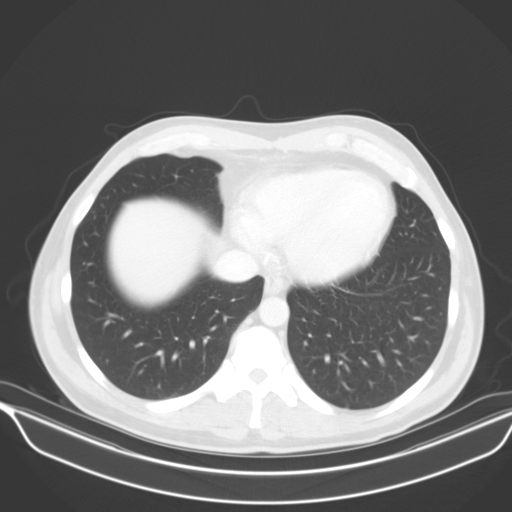

[13 of 32 positions shown; findings below may reference images not displayed]

FINDINGS: Lower chest: Lung bases are clear.

Hepatobiliary: No focal liver lesions are appreciable. Gallbladder
wall is not appreciably thickened. There is no biliary duct
dilatation.

Pancreas: There is no pancreatic mass or inflammatory focus.

Spleen: No splenic lesions are evident.

Adrenals/Urinary Tract: Adrenals appear normal bilaterally. Kidneys
bilaterally show no evident mass or hydronephrosis on either side.
There is no renal or ureteral calculus on either side. Urinary
bladder is midline with wall thickness within normal limits. No air
is seen in urinary bladder.

Stomach/Bowel: There is evidence of previous partial sigmoid
colectomy with anastomosis patent. There is localized wall
thickening near the anastomosis with localized mesenteric fluid and
thickening in this area. No diverticular irregularity is seen in
this area. No abscess or perforation is appreciable.

Elsewhere, there is no bowel wall thickening or mesenteric
thickening. No evident bowel obstruction. No free air or portal
venous air.

Vascular/Lymphatic: There is no appreciable abdominal aortic
aneurysm. No vascular lesions are appreciable. No adenopathy is
evident in the abdomen or pelvis.

Reproductive: Prostate and seminal vesicles are normal in size and
contour. There is no evident pelvic mass.

Other: Appendix appears normal. There is no abscess or ascites in
the abdomen or pelvis.

Musculoskeletal: There is degenerative change in the lumbar spine.
No blastic or lytic bone lesions evident. No intramuscular or
abdominal wall lesion.
IMPRESSION: 1. Sigmoid colitis. No irregular diverticula are identified to
indicate that diverticulitis as the cause of this sigmoid colonic
inflammation. Note that there is localized wall thickening near the
anastomosis in the sigmoid region with adjacent fluid and mesenteric
thickening. No abscess or perforation evident. No evidence of
colovesical fistula.

2. No evident bowel obstruction. No abscess elsewhere in the abdomen
or pelvis.

3. No renal or ureteral calculus. No hydronephrosis. Urinary bladder
wall thickness midline without air within the urinary bladder.

These results will be called to the ordering clinician or
representative by the Radiologist Assistant, and communication
documented in the PACS or zVision Dashboard.

## 2021-09-14 ENCOUNTER — Other Ambulatory Visit: Payer: Self-pay

## 2021-09-14 ENCOUNTER — Ambulatory Visit: Payer: Self-pay

## 2021-09-14 ENCOUNTER — Ambulatory Visit (INDEPENDENT_AMBULATORY_CARE_PROVIDER_SITE_OTHER): Payer: BC Managed Care – PPO | Admitting: Orthopaedic Surgery

## 2021-09-14 ENCOUNTER — Encounter: Payer: Self-pay | Admitting: Orthopaedic Surgery

## 2021-09-14 VITALS — Ht 73.0 in | Wt 229.0 lb

## 2021-09-14 DIAGNOSIS — M12561 Traumatic arthropathy, right knee: Secondary | ICD-10-CM | POA: Diagnosis not present

## 2021-09-14 DIAGNOSIS — M25561 Pain in right knee: Secondary | ICD-10-CM

## 2021-09-14 DIAGNOSIS — G8929 Other chronic pain: Secondary | ICD-10-CM

## 2021-09-15 DIAGNOSIS — M12561 Traumatic arthropathy, right knee: Secondary | ICD-10-CM | POA: Insufficient documentation

## 2021-09-15 NOTE — Progress Notes (Signed)
Office Visit Note   Patient: Brian Maddox           Date of Birth: 03/06/1971           MRN: [id number] Visit Date: 09/14/2021              Requested by: Sasser, Clarene Critchley, MD [address] [place],  [place] [address] PCP: Estanislado Pandy, MD   Assessment & Plan: Visit Diagnoses:  1. Chronic pain of right knee   2. Traumatic arthritis of right knee     Plan: Patient has end-stage arthritis 34 years post right knee ACL reconstruction.  Conservative treatment is no longer effective including intra-articular injections.  He like proceed with right total knee arthroplasty.  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  We looked at knee models discussed abductor block, spinal anesthesia, CPM while in hospital, Exparel, Marcaine, therapy, difficulty getting his knee out to full extension and also flexion with his limited range of motion for many years.  He should be able to stay overnight and would need home health PT x2 weeks and then could do outpatient therapy after that.  We discussed potential length of time out of work based on his progress and amount of extra work he does at home.  Questions elicited and answered he understands request to proceed.  Follow-Up Instructions: No follow-ups on file.   Orders:  Orders Placed This Encounter  Procedures   XR KNEE 3 VIEW RIGHT   No orders of the defined types were placed in this encounter.     Procedures: No procedures performed   Clinical Data: No additional findings.   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  He has been able to get on any rides with his children since his knee will only flex to 70 degrees he lacks 15 degrees knee getting his knee in full extension.  At age 63 1988 he had right ACL reconstruction done in [place]  [place].  He has had progressive osteoarthritis with pain and swelling treated with anti-inflammatories repeat intra-articular injections.  Patient's had problems with catching locking pain at night.  Patient's wife is with him today.  He has had the short section colon resected for diverticulitis.   ROS: All the systems noncontributory HPI.  Objective: Vital Signs: Ht 6\' 1"  (1.854 m)   Wt 229 lb (103.9 kg)   BMI 30.21 kg/m   Physical Exam Constitutional:      Appearance: He is well-developed.  HENT:     Head: Normocephalic and atraumatic.     Right Ear: External ear normal.     Left Ear: External ear normal.  Eyes:     Pupils: Pupils are equal, round, and reactive to light.  Neck:     Thyroid: No thyromegaly.     Trachea: No tracheal deviation.  Cardiovascular:     Rate and Rhythm: Normal rate.  Pulmonary:     Effort: Pulmonary effort is normal.     Breath sounds: No wheezing.  Abdominal:     General: Bowel sounds are normal.     Palpations: Abdomen is soft.  Musculoskeletal:     Cervical back: Neck supple.  Skin:    General: Skin is warm and dry.     Capillary  Refill: Capillary refill takes less than 2 seconds.  Neurological:     Mental Status: He is alert and oriented to person, place, and time.  Psychiatric:        Behavior: Behavior normal.        Thought Content: Thought content normal.        Judgment: Judgment normal.    Ortho Exam patient has 10 to 70 degrees range of motion collateral ligaments are stable.  Midline incision over the patellar tendon from ACL reconstruction.  Distal pulses intact negative logroll of the hips.  He is amatory with the right knee limp.  Options left knee shows full extension good flexion no swelling no crepitus.  Specialty Comments:  No specialty comments available.  Imaging: XR KNEE 3 VIEW RIGHT  Result Date: 09/15/2021 [facility] lateral right knee images are obtained and reviewed with patellofemoral sunrise view.  This shows  tricompartmental degenerative arthritis with marginal osteophytes.  ACL tunnel screws are noted.  There is loose pieces anteriorly in the notch as well as posteriorly.  Bone-on-bone changes with subchondral sclerosis marginal osteophytes and subchondral cyst formation. 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   Recurrent Diverticulitis s/p robotic sigmoid colectomy 08/07/2017 05/02/2017   Past Medical History:  Diagnosis Date   Complication of anesthesia    Difficult urinating after procedure   Diverticulitis of colon 05/02/2017    Family History  Problem Relation Age of Onset   Non-Hodgkin's lymphoma Mother    Sarcoidosis Father    Coronary artery disease Father     Past Surgical History:  Procedure Laterality Date   COLONOSCOPY N/A 05/22/2017   Procedure: COLONOSCOPY;  Surgeon: Malissa Hippo, MD;  [facility];  Service: Endoscopy;  Laterality: N/A;  2:30   HERNIA REPAIR     age 43 months   IR FIBRIN GLUE REPAIR ANAL FISTULA     last year   kidney reflux surgery     43 months old   NASAL FRACTURE SURGERY     fx nose   PROCTOSCOPY N/A 08/07/2017   Procedure: RIGID PROCTOSCOPY;  Surgeon: Karie Soda, MD;  Location: [facility];  Service: General;  Laterality: N/A;   three knee sugeries     two ACL/MCL repair. 3rd was scoped   Social History   Occupational History   Not on file  Tobacco Use   Smoking status: Never   Smokeless tobacco: Never  Vaping Use   Vaping Use: Never used  Substance and Sexual Activity   Alcohol use: Yes    Comment: rarely   Drug use: No   Sexual activity: Not on file

## 2021-10-12 ENCOUNTER — Other Ambulatory Visit: Payer: Self-pay

## 2021-11-16 ENCOUNTER — Ambulatory Visit (INDEPENDENT_AMBULATORY_CARE_PROVIDER_SITE_OTHER): Payer: BC Managed Care – PPO | Admitting: Surgery

## 2021-11-16 ENCOUNTER — Other Ambulatory Visit: Payer: Self-pay

## 2021-11-16 ENCOUNTER — Encounter: Payer: Self-pay | Admitting: Surgery

## 2021-11-16 VITALS — BP 139/90 | HR 85

## 2021-11-16 DIAGNOSIS — G8929 Other chronic pain: Secondary | ICD-10-CM

## 2021-11-16 DIAGNOSIS — M12561 Traumatic arthropathy, right knee: Secondary | ICD-10-CM

## 2021-11-16 DIAGNOSIS — M25561 Pain in right knee: Secondary | ICD-10-CM

## 2021-11-17 NOTE — Progress Notes (Signed)
Surgical Instructions    Your procedure is scheduled on Wednesday December 14th.  Report to [facility] Main Entrance "A" at 10:30 A.M., then check in with the Admitting office.  Call this number if you have problems the morning of surgery:  [phone]   If you have any questions prior to your surgery date call [phone]: Open Monday-Friday 8am-4pm    Remember:  Do not eat after midnight the night before your surgery  You may drink clear liquids until 9:30am the morning of your surgery.   Clear liquids allowed are: Water, Non-Citrus Juices (without pulp), Carbonated Beverages, Clear Tea, Black Coffee ONLY (NO MILK, CREAM OR POWDERED CREAMER of any kind), and Gatorade    Take these medicines the morning of surgery with A SIP OF WATER NONE    As of today, STOP taking any Aspirin (unless otherwise instructed by your surgeon) Aleve, Naproxen, Ibuprofen, Motrin, Advil, Goody's, BC's, all herbal medications, fish oil, and all vitamins.     After your COVID test   You are not required to quarantine however you are required to wear a well-fitting mask when you are out and around people not in your household.  If your mask becomes wet or soiled, replace with a new one.  Wash your hands often with soap and water for 20 seconds or clean your hands with an alcohol-based hand sanitizer that contains at least 60% alcohol.  Do not share personal items.  Notify your provider: if you are in close contact with someone who has COVID  or if you develop a fever of 100.4 or greater, sneezing, cough, sore throat, shortness of breath or body aches.             Do not wear jewelry  Do not wear lotions, powders, colognes, or deodorant. Do not shave 48 hours prior to surgery.  Men may shave face and neck. Do not bring valuables to the hospital. DO Not wear nail polish, gel polish, artificial nails, or any other type of covering on natural nails including finger and toenails. If patients have  artificial nails, gel coating, etc. that need to be removed by a nail salon, please have this removed prior to surgery or surgery may need to be canceled/delayed if the surgeon/ anesthesia feels like the patient is unable to be adequately monitored.             [facility] is not responsible for any belongings or valuables.  Do NOT Smoke (Tobacco/Vaping)  24 hours prior to your procedure  If you use a CPAP at night, you may bring your mask for your overnight stay.   Contacts, glasses, hearing aids, dentures or partials may not be worn into surgery, please bring cases for these belongings   For patients admitted to the hospital, discharge time will be determined by your treatment team.   Patients discharged the day of surgery will not be allowed to drive home, and someone needs to stay with them for 24 hours.  NO VISITORS WILL BE ALLOWED IN PRE-OP WHERE PATIENTS ARE PREPPED FOR SURGERY.  ONLY 1 SUPPORT PERSON MAY BE PRESENT IN THE WAITING ROOM WHILE YOU ARE IN SURGERY.  IF YOU ARE TO BE ADMITTED, ONCE YOU ARE IN YOUR ROOM YOU WILL BE ALLOWED TWO (2) VISITORS. 1 (ONE) VISITOR MAY STAY OVERNIGHT BUT MUST ARRIVE TO THE ROOM BY 8pm.  Minor children may have two parents present. Special consideration for safety and communication needs will be reviewed on a case by  case basis.  Special instructions:    Oral Hygiene is also important to reduce your risk of infection.  Remember - BRUSH YOUR TEETH THE MORNING OF SURGERY WITH YOUR REGULAR TOOTHPASTE   [facility] Preparing For Surgery  Before surgery, you can play an important role. Because skin is not sterile, your skin needs to be as free of germs as possible. You can reduce the number of germs on your skin by washing with CHG (chlorahexidine gluconate) Soap before surgery.  CHG is an antiseptic cleaner which kills germs and bonds with the skin to continue killing germs even after washing.     Please do not use if you have an allergy to CHG or  antibacterial soaps. If your skin becomes reddened/irritated stop using the CHG.  Do not shave (including legs and underarms) for at least 48 hours prior to first CHG shower. It is OK to shave your face.  Please follow these instructions carefully.     Shower the NIGHT BEFORE SURGERY and the MORNING OF SURGERY with CHG Soap.   If you chose to wash your hair, wash your hair first as usual with your normal shampoo. After you shampoo, rinse your hair and body thoroughly to remove the shampoo.  Then [employer] and genitals (private parts) with your normal soap and rinse thoroughly to remove soap.  After that Use CHG Soap as you would any other liquid soap. You can apply CHG directly to the skin and wash gently with a scrungie or a clean washcloth.   Apply the CHG Soap to your body ONLY FROM THE NECK DOWN.  Do not use on open wounds or open sores. Avoid contact with your eyes, ears, mouth and genitals (private parts). Wash Face and genitals (private parts)  with your normal soap.   Wash thoroughly, paying special attention to the area where your surgery will be performed.  Thoroughly rinse your body with warm water from the neck down.  DO NOT shower/wash with your normal soap after using and rinsing off the CHG Soap.  Pat yourself dry with a CLEAN TOWEL.  Wear CLEAN PAJAMAS to bed the night before surgery  Place CLEAN SHEETS on your bed the night before your surgery  DO NOT SLEEP WITH PETS.   Day of Surgery:  Take a shower with CHG soap. Wear Clean/Comfortable clothing the morning of surgery Do not apply any deodorants/lotions.   Remember to brush your teeth WITH YOUR REGULAR TOOTHPASTE.   Please read over the following fact sheets that you were given.

## 2021-11-17 NOTE — Progress Notes (Signed)
50 year old white male history of end-stage DJD right knee and pain comes in for preop evaluation.  States that knee symptoms unchanged from previous visit.  He is wanting to proceed with right total knee replacement as scheduled.  History physical performed.  Review of systems negative.  Surgical procedure discussed along with potential rehab/recovery time.  All questions answered and he wishes to proceed.

## 2021-11-20 ENCOUNTER — Encounter (HOSPITAL_COMMUNITY)
Admission: RE | Admit: 2021-11-20 | Discharge: 2021-11-20 | Disposition: A | Discharge status: Home / Self Care | Payer: BC Managed Care – PPO | Source: Ambulatory Visit | Attending: Orthopaedic Surgery | Admitting: Orthopaedic Surgery

## 2021-11-20 ENCOUNTER — Encounter (HOSPITAL_COMMUNITY): Payer: Self-pay

## 2021-11-20 ENCOUNTER — Telehealth: Payer: Self-pay

## 2021-11-20 ENCOUNTER — Other Ambulatory Visit: Payer: Self-pay

## 2021-11-20 VITALS — BP 134/93 | HR 65 | Temp 97.7°F | Resp 18 | Ht 74.0 in | Wt 233.1 lb

## 2021-11-20 DIAGNOSIS — Z20822 Contact with and (suspected) exposure to covid-19: Secondary | ICD-10-CM | POA: Insufficient documentation

## 2021-11-20 DIAGNOSIS — M12561 Traumatic arthropathy, right knee: Secondary | ICD-10-CM

## 2021-11-20 DIAGNOSIS — Z01818 Encounter for other preprocedural examination: Secondary | ICD-10-CM

## 2021-11-20 DIAGNOSIS — Z01812 Encounter for preprocedural laboratory examination: Secondary | ICD-10-CM | POA: Insufficient documentation

## 2021-11-20 LAB — COMPREHENSIVE METABOLIC PANEL
ALT: 39 U/L (ref 0–44)
AST: 29 U/L (ref 15–41)
Albumin: 4.3 g/dL (ref 3.5–5.0)
Alkaline Phosphatase: 121 U/L (ref 38–126)
Anion gap: 10 (ref 5–15)
BUN: 14 mg/dL (ref 6–20)
CO2: 25 mmol/L (ref 22–32)
Calcium: 9.7 mg/dL (ref 8.9–10.3)
Chloride: 104 mmol/L (ref 98–111)
Creatinine, Ser: 1.14 mg/dL (ref 0.61–1.24)
GFR, Estimated: >60 mL/min (ref >60)
Glucose, Bld: 100 mg/dL — ABNORMAL HIGH (ref 70–99)
Potassium: 4.4 mmol/L (ref 3.5–5.1)
Sodium: 139 mmol/L (ref 135–145)
Total Bilirubin: 1.1 mg/dL (ref 0.3–1.2)
Total Protein: 7.4 g/dL (ref 6.5–8.1)

## 2021-11-20 LAB — CBC
HCT: 49.1 % (ref 39.0–52.0)
Hemoglobin: 16.2 g/dL (ref 13.0–17.0)
MCH: 28.8 pg (ref 26.0–34.0)
MCHC: 33 g/dL (ref 30.0–36.0)
MCV: 87.2 fL (ref 80.0–100.0)
Platelets: 295 10*3/uL (ref 150–400)
RBC: 5.63 MIL/uL (ref 4.22–5.81)
RDW: 13.6 % (ref 11.5–15.5)
WBC: 7.6 10*3/uL (ref 4.0–10.5)
nRBC: 0 % (ref 0.0–0.2)

## 2021-11-20 LAB — URINALYSIS, ROUTINE W REFLEX MICROSCOPIC
Bilirubin Urine: NEGATIVE
Glucose, UA: NEGATIVE mg/dL
Hgb urine dipstick: NEGATIVE
Ketones, ur: NEGATIVE mg/dL
Leukocytes,Ua: NEGATIVE
Nitrite: NEGATIVE
Protein, ur: NEGATIVE mg/dL
Specific Gravity, Urine: 1.015 (ref 1.005–1.030)
pH: 6 (ref 5.0–8.0)

## 2021-11-20 LAB — SURGICAL PCR SCREEN
MRSA, PCR: NEGATIVE
Staphylococcus aureus: NEGATIVE

## 2021-11-20 LAB — SARS CORONAVIRUS 2 (TAT 6-24 HRS): SARS Coronavirus 2: NEGATIVE

## 2021-11-20 NOTE — Progress Notes (Signed)
PCP - Dr. Fara Chute Cardiologist - denies  PPM/ICD - n/a  Chest x-ray - n/a EKG - n/a Stress Test - denies ECHO - denies Cardiac Cath - denies  Sleep Study - denies CPAP - denies  Blood Thinner Instructions: n/a Aspirin Instructions: n/a  ERAS Protcol -Clear liquids until 0930 DOS PRE-SURGERY Ensure or G2- (1) pre-surgical ensure  COVID TEST- 11/20/21, done in PAT  Anesthesia review: No  Patient denies shortness of breath, fever, cough and chest pain at PAT appointment   All instructions explained to the patient, with a verbal understanding of the material. Patient agrees to go over the instructions while at home for a better understanding. Patient also instructed to self quarantine after being tested for COVID-19. The opportunity to ask questions was provided.

## 2021-11-20 NOTE — Telephone Encounter (Signed)
Pt came into the office asking for a note for his work stating that he is going out for surgery on Wednesday and that he would be out around 8-12 weeks. Just so he gets paid for going out of work before [employer].   Please advise , patient would like to note to be sent via Email

## 2021-11-20 NOTE — Telephone Encounter (Signed)
Note emailed per patient request. 

## 2021-11-22 ENCOUNTER — Encounter (HOSPITAL_COMMUNITY): Payer: Self-pay | Admitting: Orthopaedic Surgery

## 2021-11-22 ENCOUNTER — Encounter (HOSPITAL_COMMUNITY)
Admission: AD | Disposition: A | Discharge status: Home / Self Care | Payer: Self-pay | Source: Home / Self Care | Attending: Orthopaedic Surgery

## 2021-11-22 ENCOUNTER — Inpatient Hospital Stay (HOSPITAL_COMMUNITY)
Admission: AD | Admit: 2021-11-22 | Discharge: 2021-11-24 | DRG: 470 | Disposition: A | Discharge status: Home / Self Care | Payer: BC Managed Care – PPO | Attending: Orthopaedic Surgery | Admitting: Orthopaedic Surgery

## 2021-11-22 ENCOUNTER — Ambulatory Visit (HOSPITAL_COMMUNITY): Payer: BC Managed Care – PPO | Admitting: General Practice

## 2021-11-22 ENCOUNTER — Ambulatory Visit (HOSPITAL_COMMUNITY): Payer: BC Managed Care – PPO | Admitting: Vascular Surgery

## 2021-11-22 DIAGNOSIS — M12561 Traumatic arthropathy, right knee: Secondary | ICD-10-CM

## 2021-11-22 DIAGNOSIS — M76891 Other specified enthesopathies of right lower limb, excluding foot: Secondary | ICD-10-CM | POA: Diagnosis present

## 2021-11-22 DIAGNOSIS — Z20822 Contact with and (suspected) exposure to covid-19: Secondary | ICD-10-CM | POA: Diagnosis present

## 2021-11-22 DIAGNOSIS — M1711 Unilateral primary osteoarthritis, right knee: Principal | ICD-10-CM | POA: Diagnosis present

## 2021-11-22 DIAGNOSIS — N9989 Other postprocedural complications and disorders of genitourinary system: Secondary | ICD-10-CM | POA: Diagnosis present

## 2021-11-22 DIAGNOSIS — Z96651 Presence of right artificial knee joint: Secondary | ICD-10-CM

## 2021-11-22 HISTORY — PX: HARDWARE REMOVAL: SHX979

## 2021-11-22 HISTORY — PX: TOTAL KNEE ARTHROPLASTY: SHX125

## 2021-11-22 SURGERY — ARTHROPLASTY, KNEE, TOTAL
Anesthesia: Regional | Site: Knee | Laterality: Right

## 2021-11-22 MED ORDER — FENTANYL CITRATE (PF) 100 MCG/2ML IJ SOLN: 25.0000 ug | INTRAMUSCULAR | Status: DC | PRN

## 2021-11-22 MED ORDER — MENTHOL 3 MG MT LOZG: 1.0000 | LOZENGE | OROMUCOSAL | Status: DC | PRN

## 2021-11-22 MED ORDER — PROPOFOL 10 MG/ML IV BOLUS
INTRAVENOUS | Status: DC | PRN
  Administered 2021-11-22 (×3): 20 mg via INTRAVENOUS
  Administered 2021-11-22: 30 mg via INTRAVENOUS

## 2021-11-22 MED ORDER — ONDANSETRON HCL 4 MG PO TABS: 4.0000 mg | ORAL_TABLET | Freq: Four times a day (QID) | ORAL | Status: DC | PRN

## 2021-11-22 MED ORDER — PROPOFOL 10 MG/ML IV BOLUS
INTRAVENOUS | Status: AC
  Filled 2021-11-22: qty 20

## 2021-11-22 MED ORDER — BUPIVACAINE IN DEXTROSE 0.75-8.25 % IT SOLN
INTRATHECAL | Status: DC | PRN
  Administered 2021-11-22: 1.6 mL via INTRATHECAL

## 2021-11-22 MED ORDER — ONDANSETRON HCL 4 MG/2ML IJ SOLN: 4.0000 mg | Freq: Four times a day (QID) | INTRAMUSCULAR | Status: DC | PRN

## 2021-11-22 MED ORDER — MIDAZOLAM HCL 2 MG/2ML IJ SOLN
INTRAMUSCULAR | Status: AC
  Administered 2021-11-22: 12:00:00 2 mg via INTRAVENOUS
  Filled 2021-11-22: qty 2

## 2021-11-22 MED ORDER — LACTATED RINGERS IV SOLN: INTRAVENOUS | Status: DC | PRN

## 2021-11-22 MED ORDER — DEXMEDETOMIDINE (PRECEDEX) IN NS 20 MCG/5ML (4 MCG/ML) IV SYRINGE
PREFILLED_SYRINGE | INTRAVENOUS | Status: DC | PRN
  Administered 2021-11-22: 8 ug via INTRAVENOUS
  Administered 2021-11-22: 12 ug via INTRAVENOUS

## 2021-11-22 MED ORDER — PHENOL 1.4 % MT LIQD: 1.0000 | OROMUCOSAL | Status: DC | PRN

## 2021-11-22 MED ORDER — PHENYLEPHRINE HCL-NACL 20-0.9 MG/250ML-% IV SOLN
INTRAVENOUS | Status: DC | PRN
  Administered 2021-11-22: 40 ug/min via INTRAVENOUS

## 2021-11-22 MED ORDER — MIDAZOLAM HCL 2 MG/2ML IJ SOLN: 2.0000 mg | Freq: Once | INTRAMUSCULAR | Status: AC

## 2021-11-22 MED ORDER — LIDOCAINE 2% (20 MG/ML) 5 ML SYRINGE
INTRAMUSCULAR | Status: AC
  Filled 2021-11-22: qty 5

## 2021-11-22 MED ORDER — METOCLOPRAMIDE HCL 5 MG PO TABS: 5.0000 mg | ORAL_TABLET | Freq: Three times a day (TID) | ORAL | Status: DC | PRN

## 2021-11-22 MED ORDER — OXYCODONE HCL 5 MG PO TABS
5.0000 mg | ORAL_TABLET | ORAL | Status: DC | PRN
  Administered 2021-11-22 (×2): 5 mg via ORAL
  Administered 2021-11-23 – 2021-11-24 (×7): 10 mg via ORAL
  Filled 2021-11-22: qty 1
  Filled 2021-11-22 (×4): qty 2
  Filled 2021-11-22: qty 1
  Filled 2021-11-22 (×3): qty 2

## 2021-11-22 MED ORDER — HYDROMORPHONE HCL 1 MG/ML IJ SOLN
0.5000 mg | INTRAMUSCULAR | Status: DC | PRN
  Administered 2021-11-22 – 2021-11-23 (×2): 0.5 mg via INTRAVENOUS
  Administered 2021-11-23: 1 mg via INTRAVENOUS
  Administered 2021-11-23 – 2021-11-24 (×2): 0.5 mg via INTRAVENOUS
  Filled 2021-11-22 (×5): qty 1

## 2021-11-22 MED ORDER — ACETAMINOPHEN 500 MG PO TABS
1000.0000 mg | ORAL_TABLET | Freq: Once | ORAL | Status: AC
  Filled 2021-11-22: qty 2

## 2021-11-22 MED ORDER — TRANEXAMIC ACID-NACL 1000-0.7 MG/100ML-% IV SOLN
1000.0000 mg | INTRAVENOUS | Status: AC
  Administered 2021-11-22: 13:00:00 1000 mg via INTRAVENOUS
  Filled 2021-11-22: qty 100

## 2021-11-22 MED ORDER — TRANEXAMIC ACID-NACL 1000-0.7 MG/100ML-% IV SOLN
INTRAVENOUS | Status: AC
  Filled 2021-11-22: qty 100

## 2021-11-22 MED ORDER — FENTANYL CITRATE (PF) 100 MCG/2ML IJ SOLN
INTRAMUSCULAR | Status: AC
  Administered 2021-11-22: 12:00:00 100 ug via INTRAVENOUS
  Filled 2021-11-22: qty 2

## 2021-11-22 MED ORDER — FENTANYL CITRATE (PF) 100 MCG/2ML IJ SOLN: 100.0000 ug | Freq: Once | INTRAMUSCULAR | Status: AC

## 2021-11-22 MED ORDER — ONDANSETRON HCL 4 MG/2ML IJ SOLN
INTRAMUSCULAR | Status: AC
  Filled 2021-11-22: qty 2

## 2021-11-22 MED ORDER — BUPIVACAINE HCL (PF) 0.25 % IJ SOLN
INTRAMUSCULAR | Status: DC | PRN
  Administered 2021-11-22: 20 mL

## 2021-11-22 MED ORDER — CHLORHEXIDINE GLUCONATE 0.12 % MT SOLN
OROMUCOSAL | Status: AC
  Filled 2021-11-22: qty 15

## 2021-11-22 MED ORDER — PROPOFOL 1000 MG/100ML IV EMUL
INTRAVENOUS | Status: AC
  Filled 2021-11-22: qty 100

## 2021-11-22 MED ORDER — BUPIVACAINE LIPOSOME 1.3 % IJ SUSP
INTRAMUSCULAR | Status: AC
  Filled 2021-11-22: qty 20

## 2021-11-22 MED ORDER — ASPIRIN EC 325 MG PO TBEC
325.0000 mg | DELAYED_RELEASE_TABLET | Freq: Every day | ORAL | Status: DC
  Administered 2021-11-23 – 2021-11-24 (×2): 325 mg via ORAL
  Filled 2021-11-22 (×2): qty 1

## 2021-11-22 MED ORDER — ROPIVACAINE HCL 5 MG/ML IJ SOLN
INTRAMUSCULAR | Status: DC | PRN
  Administered 2021-11-22: 20 mL via PERINEURAL

## 2021-11-22 MED ORDER — POVIDONE-IODINE 10 % EX SWAB: 2.0000 "application " | Freq: Once | CUTANEOUS | Status: DC

## 2021-11-22 MED ORDER — CEFAZOLIN SODIUM-DEXTROSE 2-4 GM/100ML-% IV SOLN
2.0000 g | INTRAVENOUS | Status: AC
  Administered 2021-11-22: 13:00:00 2 g via INTRAVENOUS
  Filled 2021-11-22: qty 100

## 2021-11-22 MED ORDER — METHOCARBAMOL 500 MG PO TABS
500.0000 mg | ORAL_TABLET | Freq: Four times a day (QID) | ORAL | Status: DC | PRN
  Administered 2021-11-22 – 2021-11-24 (×5): 500 mg via ORAL
  Filled 2021-11-22 (×5): qty 1

## 2021-11-22 MED ORDER — DOCUSATE SODIUM 100 MG PO CAPS
100.0000 mg | ORAL_CAPSULE | Freq: Two times a day (BID) | ORAL | Status: DC
  Administered 2021-11-22 – 2021-11-24 (×4): 100 mg via ORAL
  Filled 2021-11-22 (×4): qty 1

## 2021-11-22 MED ORDER — ACETAMINOPHEN 325 MG PO TABS
325.0000 mg | ORAL_TABLET | Freq: Four times a day (QID) | ORAL | Status: DC | PRN
  Administered 2021-11-24: 650 mg via ORAL
  Filled 2021-11-22: qty 2

## 2021-11-22 MED ORDER — BUPIVACAINE LIPOSOME 1.3 % IJ SUSP
INTRAMUSCULAR | Status: DC | PRN
  Administered 2021-11-22: 20 mL

## 2021-11-22 MED ORDER — ACETAMINOPHEN 500 MG PO TABS
ORAL_TABLET | ORAL | Status: AC
  Administered 2021-11-22: 12:00:00 1000 mg via ORAL
  Filled 2021-11-22: qty 2

## 2021-11-22 MED ORDER — DEXAMETHASONE SODIUM PHOSPHATE 10 MG/ML IJ SOLN
INTRAMUSCULAR | Status: DC | PRN
  Administered 2021-11-22: 5 mg

## 2021-11-22 MED ORDER — PROPOFOL 500 MG/50ML IV EMUL
INTRAVENOUS | Status: DC | PRN
  Administered 2021-11-22: 100 ug/kg/min via INTRAVENOUS

## 2021-11-22 MED ORDER — METHOCARBAMOL 1000 MG/10ML IJ SOLN
500.0000 mg | Freq: Four times a day (QID) | INTRAVENOUS | Status: DC | PRN
  Filled 2021-11-22: qty 5

## 2021-11-22 MED ORDER — POLYETHYLENE GLYCOL 3350 17 G PO PACK: 17.0000 g | PACK | Freq: Every day | ORAL | Status: DC | PRN

## 2021-11-22 MED ORDER — FENTANYL CITRATE (PF) 250 MCG/5ML IJ SOLN
INTRAMUSCULAR | Status: DC | PRN
  Administered 2021-11-22: 50 ug via INTRAVENOUS

## 2021-11-22 MED ORDER — METOCLOPRAMIDE HCL 5 MG/ML IJ SOLN: 5.0000 mg | Freq: Three times a day (TID) | INTRAMUSCULAR | Status: DC | PRN

## 2021-11-22 MED ORDER — FENTANYL CITRATE (PF) 250 MCG/5ML IJ SOLN
INTRAMUSCULAR | Status: AC
  Filled 2021-11-22: qty 5

## 2021-11-22 MED ORDER — BUPIVACAINE HCL (PF) 0.25 % IJ SOLN
INTRAMUSCULAR | Status: AC
  Filled 2021-11-22: qty 30

## 2021-11-22 MED ORDER — SODIUM CHLORIDE 0.9 % IV SOLN: INTRAVENOUS | Status: DC

## 2021-11-22 MED ORDER — SODIUM CHLORIDE 0.9 % IR SOLN
Status: DC | PRN
  Administered 2021-11-22: 3000 mL

## 2021-11-22 MED ORDER — ONDANSETRON HCL 4 MG/2ML IJ SOLN
INTRAMUSCULAR | Status: DC | PRN
  Administered 2021-11-22: 4 mg via INTRAVENOUS

## 2021-11-22 SURGICAL SUPPLY — 84 items
ATTUNE MED DOME PAT 41 KNEE (Knees) ×1 IMPLANT
ATTUNE PS FEM RT SZ 8 CEM KNEE (Femur) ×1 IMPLANT
ATTUNE PSRP INSR SZ8 5 KNEE (Insert) ×1 IMPLANT
BAG COUNTER SPONGE SURGICOUNT (BAG) ×2 IMPLANT
BANDAGE ESMARK 6X9 LF (GAUZE/BANDAGES/DRESSINGS) ×1 IMPLANT
BASE TIBIAL ROT PLAT SZ 7 KNEE (Knees) IMPLANT
BENZOIN TINCTURE PRP APPL 2/3 (GAUZE/BANDAGES/DRESSINGS) ×2 IMPLANT
BLADE SAGITTAL 25.0X1.19X90 (BLADE) ×2 IMPLANT
BLADE SAW SGTL 13X75X1.27 (BLADE) ×2 IMPLANT
BNDG COHESIVE 4X5 TAN STRL (GAUZE/BANDAGES/DRESSINGS) IMPLANT
BNDG ELASTIC 4X5.8 VLCR STR LF (GAUZE/BANDAGES/DRESSINGS) ×2 IMPLANT
BNDG ELASTIC 6X5.8 VLCR STR LF (GAUZE/BANDAGES/DRESSINGS) ×1 IMPLANT
BNDG ESMARK 6X9 LF (GAUZE/BANDAGES/DRESSINGS) ×2
BNDG GAUZE ELAST 4 BULKY (GAUZE/BANDAGES/DRESSINGS) ×2 IMPLANT
BOWL SMART MIX CTS (DISPOSABLE) ×2 IMPLANT
CEMENT HV SMART SET (Cement) ×4 IMPLANT
COOLER ICEMAN CLASSIC (MISCELLANEOUS) ×1 IMPLANT
COVER SURGICAL LIGHT HANDLE (MISCELLANEOUS) ×2 IMPLANT
CUFF TOURN SGL QUICK 34 (TOURNIQUET CUFF) ×1
CUFF TOURN SGL QUICK 42 (TOURNIQUET CUFF) IMPLANT
CUFF TRNQT CYL 34X4.125X (TOURNIQUET CUFF) ×1 IMPLANT
DRAPE C-ARM 42X72 X-RAY (DRAPES) IMPLANT
DRAPE HALF SHEET 40X57 (DRAPES) IMPLANT
DRAPE INCISE IOBAN 66X45 STRL (DRAPES) IMPLANT
DRAPE ORTHO SPLIT 77X108 STRL (DRAPES) ×2
DRAPE SURG ORHT 6 SPLT 77X108 (DRAPES) ×2 IMPLANT
DRAPE U-SHAPE 47X51 STRL (DRAPES) ×2 IMPLANT
DRSG EMULSION OIL 3X3 NADH (GAUZE/BANDAGES/DRESSINGS) ×2 IMPLANT
DRSG MEPILEX BORDER 4X12 (GAUZE/BANDAGES/DRESSINGS) ×1 IMPLANT
DRSG PAD ABDOMINAL 8X10 ST (GAUZE/BANDAGES/DRESSINGS) ×2 IMPLANT
DURAPREP 26ML APPLICATOR (WOUND CARE) ×4 IMPLANT
ELECT REM PT RETURN 9FT ADLT (ELECTROSURGICAL) ×2
ELECTRODE REM PT RTRN 9FT ADLT (ELECTROSURGICAL) ×1 IMPLANT
EVACUATOR 1/8 PVC DRAIN (DRAIN) IMPLANT
FACESHIELD WRAPAROUND (MASK) ×4 IMPLANT
FACESHIELD WRAPAROUND OR TEAM (MASK) ×2 IMPLANT
GAUZE SPONGE 4X4 12PLY STRL (GAUZE/BANDAGES/DRESSINGS) ×2 IMPLANT
GAUZE XEROFORM 5X9 LF (GAUZE/BANDAGES/DRESSINGS) ×2 IMPLANT
GLOVE SRG 8 PF TXTR STRL LF DI (GLOVE) ×2 IMPLANT
GLOVE SURG ORTHO LTX SZ7.5 (GLOVE) ×4 IMPLANT
GLOVE SURG UNDER POLY LF SZ8 (GLOVE) ×2
GOWN STRL REUS W/ TWL LRG LVL3 (GOWN DISPOSABLE) ×1 IMPLANT
GOWN STRL REUS W/ TWL XL LVL3 (GOWN DISPOSABLE) ×1 IMPLANT
GOWN STRL REUS W/TWL 2XL LVL3 (GOWN DISPOSABLE) ×2 IMPLANT
GOWN STRL REUS W/TWL LRG LVL3 (GOWN DISPOSABLE) ×1
GOWN STRL REUS W/TWL XL LVL3 (GOWN DISPOSABLE) ×1
HANDPIECE INTERPULSE COAX TIP (DISPOSABLE) ×1
IMMOBILIZER KNEE 22 UNIV (SOFTGOODS) ×3 IMPLANT
KIT BASIN OR (CUSTOM PROCEDURE TRAY) ×2 IMPLANT
KIT TURNOVER KIT B (KITS) ×2 IMPLANT
MANIFOLD NEPTUNE II (INSTRUMENTS) ×2 IMPLANT
MARKER SKIN DUAL TIP RULER LAB (MISCELLANEOUS) ×2 IMPLANT
NDL 18GX1X1/2 (RX/OR ONLY) (NEEDLE) ×1 IMPLANT
NDL HYPO 25GX1X1/2 BEV (NEEDLE) ×1 IMPLANT
NEEDLE 18GX1X1/2 (RX/OR ONLY) (NEEDLE) ×2 IMPLANT
NEEDLE HYPO 25GX1X1/2 BEV (NEEDLE) ×2 IMPLANT
NS IRRIG 1000ML POUR BTL (IV SOLUTION) ×2 IMPLANT
PACK GENERAL/GYN (CUSTOM PROCEDURE TRAY) ×2 IMPLANT
PACK TOTAL JOINT (CUSTOM PROCEDURE TRAY) ×2 IMPLANT
PAD ARMBOARD 7.5X6 YLW CONV (MISCELLANEOUS) ×4 IMPLANT
PAD CAST 4YDX4 CTTN HI CHSV (CAST SUPPLIES) ×1 IMPLANT
PAD COLD SHLDR WRAP-ON (PAD) ×1 IMPLANT
PADDING CAST COTTON 4X4 STRL (CAST SUPPLIES) ×1
PADDING CAST COTTON 6X4 STRL (CAST SUPPLIES) ×2 IMPLANT
SET HNDPC FAN SPRY TIP SCT (DISPOSABLE) ×1 IMPLANT
STAPLER VISISTAT 35W (STAPLE) ×2 IMPLANT
STOCKINETTE IMPERVIOUS 9X36 MD (GAUZE/BANDAGES/DRESSINGS) IMPLANT
SUCTION FRAZIER HANDLE 10FR (MISCELLANEOUS) ×1
SUCTION TUBE FRAZIER 10FR DISP (MISCELLANEOUS) ×1 IMPLANT
SUT ETHILON 4 0 FS 1 (SUTURE) IMPLANT
SUT VIC AB 0 CT1 27 (SUTURE) ×2
SUT VIC AB 0 CT1 27XBRD ANBCTR (SUTURE) ×1 IMPLANT
SUT VIC AB 1 CTX 36 (SUTURE) ×3
SUT VIC AB 1 CTX36XBRD ANBCTR (SUTURE) ×2 IMPLANT
SUT VIC AB 2-0 CT1 27 (SUTURE) ×3
SUT VIC AB 2-0 CT1 TAPERPNT 27 (SUTURE) ×2 IMPLANT
SUT VIC AB 3-0 X1 27 (SUTURE) ×2 IMPLANT
SYR 50ML LL SCALE MARK (SYRINGE) ×2 IMPLANT
SYR CONTROL 10ML LL (SYRINGE) ×2 IMPLANT
TIBIAL BASE ROT PLAT SZ 7 KNEE (Knees) ×2 IMPLANT
TOWEL GREEN STERILE (TOWEL DISPOSABLE) ×2 IMPLANT
TOWEL GREEN STERILE FF (TOWEL DISPOSABLE) ×2 IMPLANT
TRAY CATH 16FR W/PLASTIC CATH (SET/KITS/TRAYS/PACK) IMPLANT
WATER STERILE IRR 1000ML POUR (IV SOLUTION) ×2 IMPLANT

## 2021-11-22 NOTE — Interval H&P Note (Signed)
History and Physical Interval Note:  11/22/2021 12:30 PM  Brian Maddox  has presented today for surgery, with the diagnosis of right knee osteoarthritis, post-traumatic.  The various methods of treatment have been discussed with the patient and family. After consideration of risks, benefits and other options for treatment, the patient has consented to  Procedure(s): RIGHT TOTAL KNEE ARTHROPLASTY,  REMOVAL OLD ANTERIOR CRUCIATE LIGAMENT SCREWS (Right) HARDWARE REMOVAL (Right) as a surgical intervention.  The patient's history has been reviewed, patient examined, no change in status, stable for surgery.  I have reviewed the patient's chart and labs.  Questions were answered to the patient's satisfaction.     Eldred Manges

## 2021-11-22 NOTE — Anesthesia Procedure Notes (Signed)
Spinal  Patient location during procedure: OR Start time: 11/22/2021 12:49 PM End time: 11/22/2021 12:52 PM Reason for block: surgical anesthesia Staffing Performed: anesthesiologist  Anesthesiologist: Elmer Picker, MD Preanesthetic Checklist Completed: patient identified, IV checked, risks and benefits discussed, surgical consent, monitors and equipment checked, pre-op evaluation and timeout performed Spinal Block Patient position: sitting Prep: DuraPrep and site prepped and draped Patient monitoring: cardiac monitor, continuous pulse ox and blood pressure Approach: midline Location: L3-4 Injection technique: single-shot Needle Needle type: Pencan  Needle gauge: 24 G Needle length: 9 cm Assessment Sensory level: T6 Events: CSF return Additional Notes Functioning IV was confirmed and monitors were applied. Sterile prep and drape, including hand hygiene and sterile gloves were used. The patient was positioned and the spine was prepped. The skin was anesthetized with lidocaine.  Free flow of clear CSF was obtained prior to injecting local anesthetic into the CSF.  The spinal needle aspirated freely following injection.  The needle was carefully withdrawn.  The patient tolerated the procedure well.

## 2021-11-22 NOTE — Progress Notes (Signed)
Orthopedic Tech Progress Note Patient Details:  Brian Maddox January 18, 1971 [id number]  CPM Right Knee CPM Right Knee: On Right Knee Flexion (Degrees): 0 Right Knee Extension (Degrees): 90  Post Interventions Patient Tolerated: Well Instructions Provided: Adjustment of device Ortho Devices Type of Ortho Device: CPM padding Ortho Device/Splint Interventions: Ordered, Application   Post Interventions Patient Tolerated: Well Instructions Provided: Adjustment of device  [name] Afiya Ferrebee 11/22/2021, 4:12 PM PACU RN called in order

## 2021-11-22 NOTE — Transfer of Care (Signed)
Immediate Anesthesia Transfer of Care Note  Patient: KANYE DEPREE  Procedure(s) Performed: RIGHT TOTAL KNEE ARTHROPLASTY,  REMOVAL OLD ANTERIOR CRUCIATE LIGAMENT SCREWS (Right: Knee) HARDWARE REMOVAL (Right: Knee)  Patient Location: PACU  Anesthesia Type:MAC and Regional  Level of Consciousness: awake and alert   Airway & Oxygen Therapy: Patient Spontanous Breathing  Post-op Assessment: Report given to RN and Post -op Vital signs reviewed and stable  Post vital signs: Reviewed and stable  Last Vitals:  Vitals Value Taken Time  BP 121/80 11/22/21 1535  Temp    Pulse 60 11/22/21 1536  Resp 19 11/22/21 1536  SpO2 96 % 11/22/21 1536  Vitals shown include unvalidated device data.  Last Pain:  Vitals:   11/22/21 1159  TempSrc:   PainSc: 0-No pain      Patients Stated Pain Goal: 0 (32/02/33 4356)  Complications: No notable events documented.

## 2021-11-22 NOTE — Anesthesia Procedure Notes (Signed)
Procedure Name: MAC Date/Time: 11/22/2021 12:46 PM Performed by: Ardyth Harps, CRNA Pre-anesthesia Checklist: Patient identified, Emergency Drugs available, Suction available, Patient being monitored and Timeout performed Patient Re-evaluated:Patient Re-evaluated prior to induction Oxygen Delivery Method: Simple face mask Preoxygenation: Pre-oxygenation with 100% oxygen Dental Injury: Teeth and Oropharynx as per pre-operative assessment

## 2021-11-22 NOTE — Op Note (Signed)
Preop diagnosis: Right knee primary osteoarthritis, previous ACL reconstruction 34 years ago with retained metal interference screws.  Postop diagnosis: Same  Procedure: Removal of hardware tibial interference screw, right total knee arthroplasty.  Surgeon: Annell Greening, MD  Assistant: Zonia Kief, PA-C medically necessary and present for the entire procedure  Anesthesia as preoperative abductor block with spinal anesthesia plus Exparel and Marcaine.  Tourniquet 100 minutes.  Brief history 50 year old male with progressive arthritis severe with severe knee flexion contracture he only flexes 30 to 35 degrees but has full extension.  Large posterior spurs present with tricompartmental degenerative changes.  Previous ACL screws noted and femoral screw slightly more proximal and we discussed hardware removal needed for a total knee arthroplasty.  Procedure after induction of spinal anesthesia preoperative abductor block proximal thigh tourniquet heel bump which had to be placed extremely distal since patient only flex about 30 degrees proximal thigh tourniquet was applied lateral post prepping and DuraPrep from the tourniquet the tip of toes usual total knee sheets drapes sterile skin marker and Betadine Steri-Drape was applied timeout procedure was completed IV TXA and 2 g Ancef prophylaxis.  Old incision which was midline over the patellar tendon was extended proximally distally.  Patella was very large and of being a 41 mm size and medial arthrotomy was made.  There were 2 cm proximal spur sticking off the top of the patella which was resected with a rondure.  Lateral spurs off the patella with loose piece polished which was greater than a centimeter also removed.  Large spurs removed off the femur 1 to 2 cm.  Difficult exposure since the knee would barely flex and intramedullary rod was placed after drilling up the femur for the cut.  We had disassemble and then later added the rod since the patella  was in the way.  Initially 12 mm were cut was made distal femur due to extreme contracture we took additional to the 3 mm bone off the tibia and then came back and cut an additional 4 mm.  Massive posterior spurs removed off the posterior medial femoral condyle that were 2 cm in size.  ACL was still in good shape was resected PCL was resected and meniscal remnants were resected there is tricompartmental degenerative changes.  Chamfer cuts were made on the femur sized for an 8.  Tibia was a 7.  Implants  CEMENT HV SMART SET - [id number]  Inventory Item: CEMENT HV SMART SET Serial no.:  Model/Cat no.: [id number]  Implant name: CEMENT HV SMART SET - [id number] Laterality: Right Area: Knee  Manufacturer: [employer] Date of Manufacture:    Action: Implanted Number Used: 1   Device Identifier:  Device Identifier Type:     CEMENT HV SMART SET - [id number]  Inventory Item: CEMENT HV SMART SET Serial no.:  Model/Cat no.: [id number]  Implant name: CEMENT HV SMART SET - [id number] Laterality: Right Area: Knee  Manufacturer: [employer] Date of Manufacture:    Action: Implanted Number Used: 1   Device Identifier:  Device Identifier Type:     ATTUNE MED DOME PAT KNEE - [id number]  Inventory Item: ATTUNE MED DOME PAT KNEE Serial no.:  Model/Cat no.: [id number]  Implant name: ATTUNE MED DOME PAT KNEE - [id number] Laterality: Right Area: Knee  Manufacturer: [employer] Date of Manufacture:    Action: Implanted Number Used: 1   Device Identifier:  Device Identifier Type:     ATTUNE PS FEM RT SZ 8 CEM KNEE -  [id number]  Inventory Item: ATTUNE PS FEM RT SZ 8 CEM KNEE Serial no.:  Model/Cat no.: [id number]  Implant name: ATTUNE PS FEM RT SZ 8 CEM KNEE - [id number] Laterality: Right Area: Knee  Manufacturer: [employer] Date of Manufacture:    Action: Implanted Number Used: 1   Device Identifier:  Device Identifier Type:     TIBIAL BASE ROT PLAT SZ 7 KNEE -  [id number]  Inventory Item: TIBIAL BASE ROT PLAT SZ 7 KNEE Serial no.:  Model/Cat no.: [id number]  Implant name: TIBIAL BASE ROT PLAT SZ 7 KNEE - [id number] Laterality: Right Area: Knee  Manufacturer: [employer] Date of Manufacture:    Action: Implanted Number Used: 1   Device Identifier:  Device Identifier Type:     ATTUNE PSRP INSR SZ8 KNEE - [id number]  Inventory Item: ATTUNE PSRP INSR SZ8 KNEE Serial no.:  Model/Cat no.: [id number]  Implant name: ATTUNE PSRP INSR SZ8 KNEE - [id number] Laterality: Right Area: Knee  Manufacturer: [employer] Date of Manufacture:    Action: Implanted Number Used: 1   Device Identifier:  Device Identifier Type:    With AP cut on the tibia screw was encountered and had to be gradually chipped away with an osteotome around it and then grasped with a rondure and then gradually advanced cephalad until finally could be removed.  Femoral screw was too far inside the bone was not encountered in the with the box cut on the femur was not present not noted and did not need to be removed.  Posterior spurs removed three-quarter osteotomes and extensive time was spent removing pieces of bone for align flexion finally the knee was able be flexed up to 120 degrees significant improvement from the preoperative only flexion of 30 degrees.  Trials were inserted knee came out full extension 5 mm spacer block was tight.  Vacuum mixing of the cement pulsatile lavage.  Tibia cemented first followed by femur placement of a permanent 5 mm rotating platform poly and then 41 mm 3 peg patella.  10 mm resected off of the patella and patella could not be completely flipped over for the cut but had to be cut with patella line perpendicular to the floor.  All cement was hardened 15 minutes all excess cement had been removed hemostasis was obtained Marcaine infiltration with Exparel 20+28 was 40 cc total.  After hemostasis obtained standard layered closure retinaculum 2-0 Vicryl  subtenons tissue skin staple closure postop dressing and transfer recovery room.  Instrument count needle count was correct.

## 2021-11-22 NOTE — Anesthesia Preprocedure Evaluation (Addendum)
Anesthesia Evaluation  Patient identified by MRN, date of birth, ID band Patient awake    Reviewed: Allergy & Precautions, NPO status , Patient's Chart, lab work & pertinent test results  Airway Mallampati: II  TM Distance: >3 FB Neck ROM: Full    Dental no notable dental hx. (+) Teeth Intact, Dental Advisory Given   Pulmonary neg pulmonary ROS,    Pulmonary exam normal breath sounds clear to auscultation       Cardiovascular negative cardio ROS Normal cardiovascular exam Rhythm:Regular Rate:Normal     Neuro/Psych negative neurological ROS  negative psych ROS   GI/Hepatic negative GI ROS, Neg liver ROS,   Endo/Other  negative endocrine ROS  Renal/GU negative Renal ROS  negative genitourinary   Musculoskeletal  (+) Arthritis ,   Abdominal   Peds  Hematology negative hematology ROS (+)   Anesthesia Other Findings   Reproductive/Obstetrics                            Anesthesia Physical Anesthesia Plan  ASA: 2  Anesthesia Plan: Spinal and Regional   Post-op Pain Management: Regional block and Tylenol PO (pre-op)   Induction:   PONV Risk Score and Plan: Treatment may vary due to age or medical condition, Midazolam, Propofol infusion, Dexamethasone and Ondansetron  Airway Management Planned: Natural Airway  Additional Equipment:   Intra-op Plan:   Post-operative Plan:   Informed Consent: I have reviewed the patients History and Physical, chart, labs and discussed the procedure including the risks, benefits and alternatives for the proposed anesthesia with the patient or authorized representative who has indicated his/her understanding and acceptance.     Dental advisory given  Plan Discussed with: CRNA  Anesthesia Plan Comments:         Anesthesia Quick Evaluation

## 2021-11-22 NOTE — Anesthesia Procedure Notes (Signed)
Anesthesia Regional Block: Adductor canal block   Pre-Anesthetic Checklist: , timeout performed,  Correct Patient, Correct Site, Correct Laterality,  Correct Procedure, Correct Position, site marked,  Risks and benefits discussed,  Surgical consent,  Pre-op evaluation,  At surgeon's request and post-op pain management  Laterality: Right  Prep: Maximum Sterile Barrier Precautions used, chloraprep       Needles:  Injection technique: Single-shot  Needle Type: Echogenic Stimulator Needle     Needle Length: 9cm  Needle Gauge: 22     Additional Needles:   Procedures:,,,, ultrasound used (permanent image in chart),,    Narrative:  Start time: 11/22/2021 11:50 AM End time: 11/22/2021 11:53 AM Injection made incrementally with aspirations every 5 mL.  Performed by: Personally  Anesthesiologist: Elmer Picker, MD  Additional Notes: Monitors applied. No increased pain on injection. No increased resistance to injection. Injection made in 5cc increments. Good needle visualization. Patient tolerated procedure well.

## 2021-11-22 NOTE — H&P (Signed)
TOTAL KNEE ADMISSION H&P  Patient is being admitted for right total knee arthroplasty.  Subjective:  Chief Complaint:right knee pain.  HPI: Brian Maddox, 50 y.o. male, has a history of pain and functional disability in the right knee due to trauma and arthritis and has failed non-surgical conservative treatments for greater than 12 weeks to includeNSAID's and/or analgesics, corticosteriod injections, use of assistive devices, and activity modification.  Onset of symptoms was gradual, starting 10 years ago with gradually worsening course since that time. The patient noted prior procedures on the knee to include  menisectomy and ACL reconstruction on the right knee(s).  Patient currently rates pain in the right knee(s) at 10 out of 10 with activity. Patient has night pain, worsening of pain with activity and weight bearing, pain that interferes with activities of daily living, pain with passive range of motion, crepitus, and joint swelling.  Patient has evidence of subchondral cysts, subchondral sclerosis, periarticular osteophytes, and joint space narrowing by imaging studies. There is no active infection.  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   Recurrent Diverticulitis s/p robotic sigmoid colectomy 08/07/2017 05/02/2017   Past Medical History:  Diagnosis Date   Complication of anesthesia    Difficult urinating after procedure   Diverticulitis of colon 05/02/2017    Past Surgical History:  Procedure Laterality Date   COLONOSCOPY N/A 05/22/2017   Procedure: COLONOSCOPY;  Surgeon: Malissa Hippo, MD;  Location: [facility];  Service: Endoscopy;  Laterality: N/A;  2:30   HERNIA REPAIR     age 27 months   IR FIBRIN GLUE REPAIR ANAL FISTULA     last year   kidney reflux surgery     71 months old   NASAL FRACTURE SURGERY     fx nose   PROCTOSCOPY N/A 08/07/2017   Procedure: RIGID  PROCTOSCOPY;  Surgeon: Karie Soda, MD;  Location: [facility];  Service: General;  Laterality: N/A;   three knee sugeries     two ACL/MCL repair. 3rd was scoped    Current Facility-Administered Medications  Medication Dose Route Frequency Provider Last Rate Last Admin   ceFAZolin (ANCEF) IVPB 2g/100 mL premix  2 g Intravenous On Call to OR Eldred Manges, MD       chlorhexidine (PERIDEX) 0.12 % solution            povidone-iodine 10 % swab 2 application  2 application Topical Once Eldred Manges, MD       povidone-iodine 10 % swab 2 application  2 application Topical Once Eldred Manges, MD       tranexamic acid (CYKLOKAPRON) IVPB 1,000 mg  1,000 mg Intravenous To OR Eldred Manges, MD       No Known Allergies  Social History   Tobacco Use   Smoking status: Never   Smokeless tobacco: Current    Types: Snuff  Substance Use Topics   Alcohol use: Yes    Comment: rarely    Family History  Problem Relation Age of Onset   Non-Hodgkin's lymphoma Mother    Sarcoidosis Father    Coronary artery disease Father      Review of Systems  Constitutional:  Positive for activity change.  HENT: Negative.    Respiratory: Negative.    Cardiovascular: Negative.   Gastrointestinal: Negative.   Genitourinary: Negative.   Musculoskeletal:  Positive for gait problem and joint swelling.  Psychiatric/Behavioral: Negative.  Objective:  Physical Exam HENT:     Head: Normocephalic and atraumatic.     Nose: Nose normal.  Eyes:     Extraocular Movements: Extraocular movements intact.  Cardiovascular:     Rate and Rhythm: Regular rhythm.     Heart sounds: Normal heart sounds.  Pulmonary:     Effort: Pulmonary effort is normal. No respiratory distress.     Breath sounds: Normal breath sounds.  Abdominal:     General: There is no distension.  Musculoskeletal:        General: Tenderness present.     Cervical back: Normal range of motion.  Neurological:     Mental Status: He is alert and oriented  to person, place, and time.  Psychiatric:        Mood and Affect: Mood normal.    Vital signs in last 24 hours: Temp:  [97.5 F (36.4 C)] 97.5 F (36.4 C) (12/14 1022) Pulse Rate:  [57] 57 (12/14 1022) Resp:  [18] 18 (12/14 1022) BP: (141)/(88) 141/88 (12/14 1022) SpO2:  [99 %] 99 % (12/14 1022) Weight:  [105.7 kg] 105.7 kg (12/14 1025)  Labs:   Estimated body mass index is 29.92 kg/m as calculated from the following:   Height as of this encounter: 6\' 2"  (1.88 m).   Weight as of this encounter: 105.7 kg.   Imaging Review Plain radiographs demonstrate moderate degenerative joint disease of the right knee(s). The overall alignment ismild varus. The bone quality appears to be good for age and reported activity level.      Assessment/Plan:  End stage arthritis, right knee   The patient history, physical examination, clinical judgment of the provider and imaging studies are consistent with end stage degenerative joint disease of the right knee(s) and total knee arthroplasty is deemed medically necessary. The treatment options including medical management, injection therapy arthroscopy and arthroplasty were discussed at length. The risks and benefits of total knee arthroplasty were presented and reviewed. The risks due to aseptic loosening, infection, stiffness, patella tracking problems, thromboembolic complications and other imponderables were discussed. The patient acknowledged the explanation, agreed to proceed with the plan and consent was signed. Patient is being admitted for inpatient treatment for surgery, pain control, PT, OT, prophylactic antibiotics, VTE prophylaxis, progressive ambulation and ADL's and discharge planning. The patient is planning to be discharged home with home health services     Patient's anticipated LOS is less than 2 midnights, meeting these requirements: - Younger than 44 - Lives within 1 hour of care - Has a competent adult at home to recover  with post-op recover - NO history of  - Chronic pain requiring opiods  - Diabetes  - Coronary Artery Disease  - Heart failure  - Heart attack  - Stroke  - DVT/VTE  - Cardiac arrhythmia  - Respiratory Failure/COPD  - Renal failure  - Anemia  - Advanced Liver disease

## 2021-11-22 NOTE — Anesthesia Postprocedure Evaluation (Signed)
Anesthesia Post Note  Patient: Brian Maddox  Procedure(s) Performed: RIGHT TOTAL KNEE ARTHROPLASTY,  REMOVAL OLD ANTERIOR CRUCIATE LIGAMENT SCREWS (Right: Knee) HARDWARE REMOVAL (Right: Knee)     Patient location during evaluation: PACU Anesthesia Type: Regional and Spinal Level of consciousness: awake and alert Pain management: pain level controlled Vital Signs Assessment: post-procedure vital signs reviewed and stable Respiratory status: spontaneous breathing, nonlabored ventilation, respiratory function stable and patient connected to nasal cannula oxygen Cardiovascular status: blood pressure returned to baseline and stable Postop Assessment: no apparent nausea or vomiting, no headache, no backache and spinal receding Anesthetic complications: no   No notable events documented.  Last Vitals:  Vitals:   11/22/21 1620 11/22/21 1635  BP: 100/66 107/63  Pulse: (!) 49 (!) 58  Resp: 12 10  Temp:  36.6 C  SpO2: 100% 100%    Last Pain:  Vitals:   11/22/21 1635  TempSrc:   PainSc: 0-No pain    LLE Motor Response: Purposeful movement (11/22/21 1732) LLE Sensation: Full sensation (11/22/21 1732) RLE Motor Response: Purposeful movement (11/22/21 1732) RLE Sensation: Full sensation (11/22/21 1732)      Cambre Matson L Sumire Halbleib

## 2021-11-23 ENCOUNTER — Encounter (HOSPITAL_COMMUNITY): Payer: Self-pay | Admitting: Orthopaedic Surgery

## 2021-11-23 DIAGNOSIS — Z20822 Contact with and (suspected) exposure to covid-19: Secondary | ICD-10-CM | POA: Diagnosis present

## 2021-11-23 DIAGNOSIS — M76891 Other specified enthesopathies of right lower limb, excluding foot: Secondary | ICD-10-CM | POA: Diagnosis present

## 2021-11-23 DIAGNOSIS — M1711 Unilateral primary osteoarthritis, right knee: Secondary | ICD-10-CM | POA: Diagnosis present

## 2021-11-23 DIAGNOSIS — Z96651 Presence of right artificial knee joint: Secondary | ICD-10-CM

## 2021-11-23 DIAGNOSIS — N9989 Other postprocedural complications and disorders of genitourinary system: Secondary | ICD-10-CM | POA: Diagnosis present

## 2021-11-23 LAB — BASIC METABOLIC PANEL
Anion gap: 9 (ref 5–15)
BUN: 14 mg/dL (ref 6–20)
CO2: 20 mmol/L — ABNORMAL LOW (ref 22–32)
Calcium: 8.6 mg/dL — ABNORMAL LOW (ref 8.9–10.3)
Chloride: 104 mmol/L (ref 98–111)
Creatinine, Ser: 1.25 mg/dL — ABNORMAL HIGH (ref 0.61–1.24)
GFR, Estimated: >60 mL/min (ref >60)
Glucose, Bld: 159 mg/dL — ABNORMAL HIGH (ref 70–99)
Potassium: 4.3 mmol/L (ref 3.5–5.1)
Sodium: 133 mmol/L — ABNORMAL LOW (ref 135–145)

## 2021-11-23 LAB — CBC
HCT: 40.7 % (ref 39.0–52.0)
Hemoglobin: 13.5 g/dL (ref 13.0–17.0)
MCH: 28.5 pg (ref 26.0–34.0)
MCHC: 33.2 g/dL (ref 30.0–36.0)
MCV: 85.9 fL (ref 80.0–100.0)
Platelets: 252 10*3/uL (ref 150–400)
RBC: 4.74 MIL/uL (ref 4.22–5.81)
RDW: 13.4 % (ref 11.5–15.5)
WBC: 16.9 10*3/uL — ABNORMAL HIGH (ref 4.0–10.5)
nRBC: 0 % (ref 0.0–0.2)

## 2021-11-23 LAB — GLUCOSE, CAPILLARY
Glucose-Capillary: 116 mg/dL — ABNORMAL HIGH (ref 70–99)
Glucose-Capillary: 124 mg/dL — ABNORMAL HIGH (ref 70–99)

## 2021-11-23 MED ORDER — ASPIRIN 325 MG PO TBEC
325.0000 mg | DELAYED_RELEASE_TABLET | Freq: Every day | ORAL | 0 refills | Status: AC
Start: 2021-11-24 — End: ?

## 2021-11-23 MED ORDER — OXYCODONE-ACETAMINOPHEN 7.5-325 MG PO TABS: 1.0000 | ORAL_TABLET | Freq: Four times a day (QID) | ORAL | 0 refills | Status: DC | PRN

## 2021-11-23 MED ORDER — METHOCARBAMOL 500 MG PO TABS: 500.0000 mg | ORAL_TABLET | Freq: Four times a day (QID) | ORAL | 0 refills | Status: AC | PRN

## 2021-11-23 NOTE — Discharge Instructions (Signed)

## 2021-11-23 NOTE — Progress Notes (Signed)
Called portable twice to get foot pump, according to them, non available right now.  RN also check other floors and unable to find machine. Will notify dayshift RN.

## 2021-11-23 NOTE — Plan of Care (Signed)

## 2021-11-23 NOTE — Progress Notes (Signed)
Patient ID: Brian Maddox, male   DOB: November 29, 1971, 50 y.o.   MRN: [id number]   Subjective: 1 Day Post-Op Procedure(s) (LRB): RIGHT TOTAL KNEE ARTHROPLASTY,  REMOVAL OLD ANTERIOR CRUCIATE LIGAMENT SCREWS (Right) HARDWARE REMOVAL (Right) Patient reports pain as mild and moderate.    Objective: Vital signs in last 24 hours: Temp:  [97.5 F (36.4 C)-98.8 F (37.1 C)] 98.4 F (36.9 C) (12/15 0819) Pulse Rate:  [49-86] 73 (12/15 0819) Resp:  [10-19] 15 (12/15 0819) BP: (100-141)/(63-88) 115/70 (12/15 0819) SpO2:  [94 %-100 %] 98 % (12/15 0819) Weight:  [105.7 kg] 105.7 kg (12/14 1025)  Intake/Output from previous day: 12/14 0701 - 12/15 0700 In: 2652.1 [P.O.:960; I.V.:1492.1; IV Piggyback:200] Out: 2350 [Urine:2300; Blood:50] Intake/Output this shift: No intake/output data recorded.  Recent Labs    11/20/21 0922 11/23/21 0111  HGB 16.2 13.5   Recent Labs    11/20/21 0922 11/23/21 0111  WBC 7.6 16.9*  RBC 5.63 4.74  HCT 49.1 40.7  PLT 295 252   Recent Labs    11/20/21 0922 11/23/21 0111  NA 139 133*  K 4.4 4.3  CL 104 104  CO2 25 20*  BUN 14 14  CREATININE 1.14 1.25*  GLUCOSE 100* 159*  CALCIUM 9.7 8.6*   No results for input(s): LABPT, INR in the last 72 hours.  Neurologically intact No results found.  Assessment/Plan: 1 Day Post-Op Procedure(s) (LRB): RIGHT TOTAL KNEE ARTHROPLASTY,  REMOVAL OLD ANTERIOR CRUCIATE LIGAMENT SCREWS (Right) HARDWARE REMOVAL (Right) Up with therapy, on CPM to 90 degrees.  Likely home late today if does well with PT.   He requested ice machine for home.   Eldred Manges 11/23/2021, 9:07 AM

## 2021-11-23 NOTE — Progress Notes (Signed)
PT Cancellation Note  Patient Details Name: Brian Maddox MRN: [id number] DOB: 1971/01/31   Cancelled Treatment:    Reason Eval/Treat Not Completed: Medical issues which prohibited therapy.   Contacting medical team regarding popping feeling pt describes with CPM use, and will return when decision about appropriateness is made.   Ivar Drape 11/23/2021, 11:21 AM  Samul Dada, PT PhD Acute Rehab Dept. Number: [facility] [id number] and [facility] [phone]

## 2021-11-23 NOTE — Progress Notes (Signed)
Patient ID: Brian Maddox, male   DOB: 1971/07/11, 51 y.o.   MRN: [id number] Despite patient working she came up to 90 degrees when he got up he was lightheaded with not able progress and ambulate in the hall felt weak.  Running temperature 99.6.  Incentive spirometry, continue PT tomorrow hopefully discharge tomorrow afternoon if he progresses.  Patient changed to admission status.

## 2021-11-23 NOTE — Evaluation (Addendum)
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 PMHx:  OA, ACL injury R knee, diverticulitis, colectomy, colvesical fistula  Clinical Impression  Pt was seen for mobility on side of bed and to get to BR with RW, demonstrating ability to get up with minor help.  Became light headed with standing to get to BR, sat and rested but then able to continue with stops.  Pt is asking to get cleaned up and assisted to bathroom for his quick cleanup.  Pt is able to stand with min guard in R knee immobilizer, but with gait needs to have only min guard once up.  Distance is limited by light headed feeling, and will continue to monitor him and check orthostatics as needed.  Pt is motivated to get home and will work in next couple sessions to have him review HEP and instruct in stair sequence with options explained.  Follow for acute PT goals as outlined below.      Recommendations for follow up therapy are one component of a multi-disciplinary discharge planning process, led by the attending physician.  Recommendations may be updated based on patient status, additional functional criteria and insurance authorization.  Follow Up Recommendations Follow physician's recommendations for discharge plan and follow up therapies    Assistance Recommended at Discharge Intermittent Supervision/Assistance  Functional Status Assessment Patient has had a recent decline in their functional status and demonstrates the ability to make significant improvements in function in a reasonable and predictable amount of time.  Equipment Recommendations  Rolling walker (2 wheels);BSC/3in1    Recommendations for Other Services       Precautions / Restrictions Precautions Precautions:  Knee Precaution Booklet Issued: Yes (comment) Precaution Comments: reivewed R knee precautions Required Braces or Orthoses: Knee Immobilizer - Right Knee Immobilizer - Right: On except when in CPM Restrictions Weight Bearing Restrictions: Yes RLE Weight Bearing: Weight bearing as tolerated      Mobility  Bed Mobility Overal bed mobility: Needs Assistance Bed Mobility: Supine to Sit     Supine to sit: Min assist     General bed mobility comments: min assist to support trunk and to help move RLE    Transfers Overall transfer level: Needs assistance Equipment used: Rolling walker (2 wheels);1 person hand held assist Transfers: Sit to/from Stand Sit to Stand: Min assist;Mod assist           General transfer comment: first trial mod assist then consistently min assist    Ambulation/Gait Ambulation/Gait assistance: Min guard Gait Distance (Feet): 20 Feet (14+6) Assistive device: Rolling walker (2 wheels);1 person hand held assist Gait Pattern/deviations: Step-through pattern;Decreased stride length;Wide base of support;Decreased weight shift to right Gait velocity: reduced   Pre-gait activities: standing balance and sidesteps General Gait Details: pt self limits to TDWB on RLE  Stairs            Wheelchair Mobility    Modified Rankin (Stroke Patients Only)       Balance Overall balance assessment: Needs assistance Sitting-balance support: Feet supported;Bilateral upper extremity supported Sitting balance-Leahy Scale: Fair     Standing balance support: Bilateral upper extremity supported;During functional activity Standing balance-Leahy Scale: Poor  Pertinent Vitals/Pain Pain Assessment: Faces Faces Pain Scale: Hurts whole lot Pain Location: R knee with initial mobility Pain Descriptors / Indicators: Grimacing;Guarding;Operative site guarding Pain Intervention(s): Limited activity within patient's  tolerance;Monitored during session;Premedicated before session;Repositioned;Ice applied    Home Living Family/patient expects to be discharged to:: Private residence Living Arrangements: Alone Available Help at Discharge: Family;Available 24 hours/day Type of Home: House Home Access: Stairs to enter Entrance Stairs-Rails: Can reach both Entrance Stairs-Number of Steps: 3+1+1   Home Layout: One level Home Equipment: None Additional Comments: has equipment from his father who is also tall but may not be in repair    Prior Function Prior Level of Function : Independent/Modified Independent             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 Dominance   Dominant Hand: Right    Extremity/Trunk Assessment   Upper Extremity Assessment Upper Extremity Assessment: Overall WFL for tasks assessed    Lower Extremity Assessment Lower Extremity Assessment: RLE deficits/detail RLE Deficits / Details: in immobilizer RLE: Unable to fully assess due to immobilization RLE Sensation: WNL RLE Coordination: decreased gross motor    Cervical / Trunk Assessment Cervical / Trunk Assessment: Normal  Communication   Communication: No difficulties  Cognition Arousal/Alertness: Awake/alert Behavior During Therapy: WFL for tasks assessed/performed Overall Cognitive Status: Within Functional Limits for tasks assessed                                 General Comments: Pt was assisted to move into bathroom and get bathed, sitting on edge of commode        General Comments General comments (skin integrity, edema, etc.): Pt is up to walk in room to BR and then to assist to chair and sat up for AM.  had lengthy session due to finding out of pt can move and use RLE over noise with CPM intially, then time to assist to stand and to get out of bathroom    Exercises     Assessment/Plan    PT Assessment Patient needs continued PT services  PT Problem  List Decreased strength;Decreased activity tolerance;Decreased balance;Decreased mobility;Decreased knowledge of use of DME;Decreased safety awareness;Decreased skin integrity;Pain       PT Treatment Interventions DME instruction;Gait training;Functional mobility training;Therapeutic activities;Therapeutic exercise;Balance training;Neuromuscular re-education;Patient/family education    PT Goals (Current goals can be found in the Care Plan section)  Acute Rehab PT Goals Patient Stated Goal: to walk and get home PT Goal Formulation: With patient Time For Goal Achievement: 12/07/21 Potential to Achieve Goals: Good    Frequency 7X/week   Barriers to discharge Inaccessible home environment;Decreased caregiver support stairs to enter the house, needs family help but lives alone    Co-evaluation               AM-PAC PT "6 Clicks" Mobility  Outcome Measure Help needed turning from your back to your side while in a flat bed without using bedrails?: A Little Help needed moving from lying on your back to sitting on the side of a flat bed without using bedrails?: A Little Help needed moving to and from a bed to a chair (including a wheelchair)?: A Little Help needed standing up from a chair using your arms (e.g., wheelchair or bedside chair)?: A Little Help needed to walk in hospital room?: A Little Help needed climbing 3-5 steps with a  railing? : Total 6 Click Score: 16    End of Session Equipment Utilized During Treatment: Gait belt Activity Tolerance: Patient tolerated treatment well;Patient limited by fatigue;Patient limited by pain Patient left: in chair;with call bell/phone within reach;with chair alarm set Nurse Communication: Mobility status;Weight bearing status PT Visit Diagnosis: Unsteadiness on feet (R26.81);Muscle weakness (generalized) (M62.81);Pain Pain - Right/Left: Right Pain - part of body: Knee    Time: 0321-2248 PT Time Calculation (min) (ACUTE ONLY): 65  min   Charges:   PT Evaluation $PT Eval Moderate Complexity: 1 Mod PT Treatments $Gait Training: 8-22 mins $Therapeutic Activity: 23-37 mins       Ivar Drape 11/23/2021, 4:32 PM  Samul Dada, PT PhD Acute Rehab Dept. Number: [facility] [id number] and [facility] [phone]

## 2021-11-24 ENCOUNTER — Telehealth: Payer: Self-pay | Admitting: Radiology

## 2021-11-24 LAB — CBC
HCT: 38.4 % — ABNORMAL LOW (ref 39.0–52.0)
Hemoglobin: 12.5 g/dL — ABNORMAL LOW (ref 13.0–17.0)
MCH: 28.1 pg (ref 26.0–34.0)
MCHC: 32.6 g/dL (ref 30.0–36.0)
MCV: 86.3 fL (ref 80.0–100.0)
Platelets: 244 10*3/uL (ref 150–400)
RBC: 4.45 MIL/uL (ref 4.22–5.81)
RDW: 13.7 % (ref 11.5–15.5)
WBC: 16.2 10*3/uL — ABNORMAL HIGH (ref 4.0–10.5)
nRBC: 0 % (ref 0.0–0.2)

## 2021-11-24 MED ORDER — TAMSULOSIN HCL 0.4 MG PO CAPS
0.4000 mg | ORAL_CAPSULE | Freq: Every day | ORAL | Status: DC
  Administered 2021-11-24: 0.4 mg via ORAL
  Filled 2021-11-24: qty 1

## 2021-11-24 MED ORDER — TAMSULOSIN HCL 0.4 MG PO CAPS: 0.4000 mg | ORAL_CAPSULE | Freq: Every day | ORAL | Status: AC

## 2021-11-24 NOTE — Progress Notes (Signed)
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 PMHx:  OA, ACL injury R knee, diverticulitis, colectomy, colvesical fistula    PT Comments    Pt was seen for progression of therapy goals, including continued work on tendency to over reach RLE step and to stand more upright.  He is anxious to go home, and HHPT will follow up to continue to work on the tendencies.  Has gait belt for use to both move RLE and to progress with his safety of gait on longer distances.  Follow up with PT tomorrow if here and could revisit stairs to make sure he is still becoming more independent with all movement.  Focus on sit to stand as well to  integrate teaching of power up during today's session.   Recommendations for follow up therapy are one component of a multi-disciplinary discharge planning process, led by the attending physician.  Recommendations may be updated based on patient status, additional functional criteria and insurance authorization.  Follow Up Recommendations  Follow physician's recommendations for discharge plan and follow up therapies     Assistance Recommended at Discharge Intermittent Supervision/Assistance  Equipment Recommendations  Rolling walker (2 wheels);BSC/3in1    Recommendations for Other Services       Precautions / Restrictions Precautions Precautions: Knee Precaution Booklet Issued: Yes (comment) Precaution Comments: progressed to flexion ROM on R knee in sitting Required Braces or Orthoses: Knee Immobilizer - Right Knee Immobilizer - Right: Other (comment) (on in bed and off during PT) Restrictions Weight Bearing Restrictions: Yes RLE Weight Bearing: Weight bearing as tolerated     Mobility  Bed Mobility Overal  bed mobility: Needs Assistance Bed Mobility: Supine to Sit     Supine to sit: Min guard     General bed mobility comments: instructed pt to use belt and support knee to pivot to side of bed    Transfers Overall transfer level: Needs assistance Equipment used: Rolling walker (2 wheels) Transfers: Sit to/from Stand Sit to Stand: Mod assist           General transfer comment: mod initially then less, down to min assist    Ambulation/Gait Ambulation/Gait assistance: Min guard Gait Distance (Feet): 170 Feet Assistive device: Rolling walker (2 wheels);1 person hand held assist Gait Pattern/deviations: Step-through pattern;Step-to pattern;Decreased stride length;Decreased weight shift to right Gait velocity: reduced Gait velocity interpretation: <1.31 ft/sec, indicative of household ambulator Pre-gait activities: sidesteps and postural correction General Gait Details: encouraged him to shorten R LE step to avoid the step to pattern, requires repetitive reminders   Stairs Stairs: Yes Stairs assistance: Min guard Stair Management: One rail Right;Step to pattern;Forwards Number of Stairs: 3 General stair comments: pt was assisted to step up with LLE and step down on RLE   Wheelchair Mobility    Modified Rankin (Stroke Patients Only)       Balance Overall balance assessment: Needs assistance Sitting-balance support: Feet supported;Single extremity supported Sitting balance-Leahy Scale: Good Sitting balance - Comments: reminders not to scoot to EOB with slick shorts Postural control: Posterior lean Standing balance support: Bilateral upper extremity supported;During functional activity Standing balance-Leahy Scale: Fair Standing balance comment: fair with walker to steady himself  Cognition Arousal/Alertness: Awake/alert Behavior During Therapy: WFL for tasks assessed/performed Overall Cognitive Status: Within Functional Limits  for tasks assessed                                          Exercises Total Joint Exercises Ankle Circles/Pumps: AROM;5 reps Long Arc Quad: AROM Knee Flexion: AROM;10 reps Goniometric ROM: 65    General Comments General comments (skin integrity, edema, etc.): pt was instructed to stretch R hip in extension with standign supported effort to improve stading posture and control his tendency to over reach steps.  Pt is able to demo the stretch      Pertinent Vitals/Pain Pain Assessment: 0-10 Pain Score: 4  Pain Location: R knee with wbing Pain Descriptors / Indicators: Guarding;Grimacing Pain Intervention(s): Monitored during session;Repositioned;Ice applied    Home Living                          Prior Function            PT Goals (current goals can now be found in the care plan section) Acute Rehab PT Goals Patient Stated Goal: walk and go home today Progress towards PT goals: Progressing toward goals    Frequency    7X/week      PT Plan Current plan remains appropriate    Co-evaluation              AM-PAC PT "6 Clicks" Mobility   Outcome Measure  Help needed turning from your back to your side while in a flat bed without using bedrails?: A Little Help needed moving from lying on your back to sitting on the side of a flat bed without using bedrails?: A Little Help needed moving to and from a bed to a chair (including a wheelchair)?: A Little Help needed standing up from a chair using your arms (e.g., wheelchair or bedside chair)?: A Little Help needed to walk in hospital room?: A Little Help needed climbing 3-5 steps with a railing? : A Little 6 Click Score: 18    End of Session Equipment Utilized During Treatment: Gait belt Activity Tolerance: Patient tolerated treatment well;Patient limited by fatigue;Patient limited by pain Patient left: with call bell/phone within reach;in bed;with bed alarm set Nurse Communication:  Mobility status;Weight bearing status PT Visit Diagnosis: Unsteadiness on feet (R26.81);Muscle weakness (generalized) (M62.81);Pain Pain - Right/Left: Right Pain - part of body: Knee     Time: 2725-3664 PT Time Calculation (min) (ACUTE ONLY): 27 min  Charges:  $Gait Training: 8-22 mins $Therapeutic Exercise: 8-22 mins $Therapeutic Activity: 8-22 mins             Brian Maddox 11/24/2021, 5:06 PM  Samul Dada, PT PhD Acute Rehab Dept. Number: [facility] [id number] and [facility] [phone]

## 2021-11-24 NOTE — Progress Notes (Signed)
Mobility Specialist Criteria Algorithm Info.   11/24/21 1040  Pain Assessment  Pain Assessment 0-10  Pain Score 5  Pain Location  (R knee tolerating better without brace)  Pain Descriptors / Indicators Dull;Aching  Pain Intervention(s) Monitored during session  Mobility  Activity Ambulated in hall  Range of Motion/Exercises Active;All extremities  Level of Assistance Standby assist, set-up cues, supervision of patient - no hands on  Assistive Device Front wheel walker  RLE Weight Bearing WBAT  Distance Ambulated (ft) 360 ft  Mobility Ambulated with assistance in hallway  Mobility Response Tolerated well  Mobility performed by Mobility specialist  Bed Position Semi-fowlers   Patient received in bed motivated and eager to participate in mobility. Was independent for bed level mobility and getting to the EOB. Required minimal HHA sit>stand + cues for safe hand placement. Ambulated in hallway with supervision requiring frequent cues to slow down and to not overstep. Returned to room without incident or complaint. Was left in bed with all needs met and call bell in reach.   11/24/2021 11:19 AM

## 2021-11-24 NOTE — Progress Notes (Signed)
Patient ID: Brian Maddox, male   DOB: 27-Jan-1971, 50 y.o.   MRN: [id number]   Subjective: 2 Days Post-Op Procedure(s) (LRB): RIGHT TOTAL KNEE ARTHROPLASTY,  REMOVAL OLD ANTERIOR CRUCIATE LIGAMENT SCREWS (Right) HARDWARE REMOVAL (Right) Patient reports pain as mild and moderate.    Objective: Vital signs in last 24 hours: Temp:  [98.3 F (36.8 C)-99.4 F (37.4 C)] 98.8 F (37.1 C) (12/16 0804) Pulse Rate:  [88-94] 92 (12/16 0804) Resp:  [15-20] 19 (12/16 0804) BP: (127-146)/(69-86) 133/83 (12/16 0804) SpO2:  [96 %-98 %] 96 % (12/16 0804)  Intake/Output from previous day: 12/15 0701 - 12/16 0700 In: 1080 [P.O.:1080] Out: 950 [Urine:950] Intake/Output this shift: No intake/output data recorded.  Recent Labs    11/23/21 0111 11/24/21 0056  HGB 13.5 12.5*   Recent Labs    11/23/21 0111 11/24/21 0056  WBC 16.9* 16.2*  RBC 4.74 4.45  HCT 40.7 38.4*  PLT 252 244   Recent Labs    11/23/21 0111  NA 133*  K 4.3  CL 104  CO2 20*  BUN 14  CREATININE 1.25*  GLUCOSE 159*  CALCIUM 8.6*   No results for input(s): LABPT, INR in the last 72 hours.  Neurologically intact No results found.  Assessment/Plan: 2 Days Post-Op Procedure(s) (LRB): RIGHT TOTAL KNEE ARTHROPLASTY,  REMOVAL OLD ANTERIOR CRUCIATE LIGAMENT SCREWS (Right) HARDWARE REMOVAL (Right) Up with therapy, Has problems voiding , post op urinary retention with post void bladder scan 400cc. Voided 250.  Will start Flomax, added to discharge meds, will see how he does this afternoon and decide on discharge with or without foley.   Eldred Manges 11/24/2021, 11:40 AM

## 2021-11-24 NOTE — Progress Notes (Signed)
Patient discharged via wheel chair at this time. Patient hemodynamically stable during discharge.  °

## 2021-11-24 NOTE — Progress Notes (Signed)
At 1030 patient expressed concern about not being able to void. Did a bladder scan that showed >425ml. Patient encouraged to attempt voiding at this time and was able to void . Bladder scan post void showed .  At 1038, called Dr Ophelia Charter and informed about the situation. Per Dr Ophelia Charter continue to monitor and attempt more voiding trials by the patient. Will follow the order and continue to monitor.

## 2021-11-24 NOTE — Progress Notes (Signed)
Patient ID: Brian Maddox, male   DOB: 07/11/1971, 50 y.o.   MRN: [id number] Patient voiding well with Flomax.  Discharged home.  He needs home physical therapy starting next week Monday Wednesday Friday 3 times a week.  I do not see a note from care management/social worker.  I called and spoke with his nurse and she will message them to make sure that this is done so they can start seeing him next week.

## 2021-11-24 NOTE — Plan of Care (Signed)

## 2021-11-24 NOTE — TOC Transition Note (Addendum)
Transition of Care [facility]) - CM/SW Discharge Note   Patient Details  Name: Brian Maddox MRN: [id number] Date of Birth: Oct 24, 1971  [facility] CM/SW Contact:  Epifanio Lesches, RN Phone Number: 11/24/2021, 6:01 PM   Clinical Narrative:    Patient will DC to: home Anticipated DC date: 11/24/2021 Family notified: yes Transport by: car         - s/p Removal of hardware tibial interference screw, right total knee arthroplasty 12/14 Per MD patient ready for DC today . RN and patient aware of DC plan. NCM noted order for home health services. 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 NCM unable to secure [facility] services 2/2 service area, MD made aware. MD requested NCM f/u with his nurse to help facilitate/secure home health vs outpatient services on Monday. NCM called nurse Sherrie and made her aware. Sherrie stated she's  going into MD's office on tomorrow and will work on securing therapy for pt. Sherrie to f/u with pt. DME : RW and BSC , referral made with [employer] earlier. Equipment already delivered to bedside.   Post hospital f/u noted on AVS.  Pt without Rx med concerns.  Pt states family/friends will help assist with care once d/c.   RNCM will sign off for now as intervention is no longer needed. Please consult [place] again if new needs arise.   Final next level of care: Home w Home Health Services Barriers to Discharge: No Barriers Identified   Patient Goals and CMS Choice     Choice offered to / list presented to : Patient  Discharge Placement                       Discharge Plan and Services                DME Arranged: 3-N-1, Walker rolling DME Agency: [employer] Date DME Agency Contacted: 11/24/21 Time DME Agency Contacted: 1251 Representative spoke with at DME Agency: Francesco Sor Arranged: PT          Social Determinants of Health (SDOH) Interventions     Readmission Risk Interventions No flowsheet data found.

## 2021-11-24 NOTE — Progress Notes (Signed)
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 PMHx:  OA, ACL injury R knee, diverticulitis, colectomy, colvesical fistula    PT Comments    Pt was up to walk after walking a longer trip with mobility tech and then resting.  He is able to handle stair climbing, mainly having issues with pain management.  However, he is strong enough on RLE to handle the work of getting down the hall and getting into his house at the time of DC.  Follow up with him for progressing exercises, and continue to encourage him to WB on RLE as he is walking, as he tends to wb on flat foot then to toes as he fatigues and gets a bit uncomfortable.  Follow up as acute goals dictate.   Recommendations for follow up therapy are one component of a multi-disciplinary discharge planning process, led by the attending physician.  Recommendations may be updated based on patient status, additional functional criteria and insurance authorization.  Follow Up Recommendations  Follow physician's recommendations for discharge plan and follow up therapies     Assistance Recommended at Discharge Intermittent Supervision/Assistance  Equipment Recommendations  Rolling walker (2 wheels);BSC/3in1    Recommendations for Other Services       Precautions / Restrictions Precautions Precautions: Knee Precaution Booklet Issued: Yes (comment) Precaution Comments: progressed to flexion ROM on R knee in sitting Required Braces or Orthoses: Knee Immobilizer - Right Knee Immobilizer - Right: Other (comment) (on in bed and off during PT) Restrictions Weight Bearing Restrictions: Yes RLE Weight Bearing: Weight bearing as tolerated     Mobility  Bed Mobility Overal bed mobility: Needs  Assistance Bed Mobility: Supine to Sit     Supine to sit: Min guard     General bed mobility comments: instructed pt to use belt and support knee to pivot to side of bed    Transfers Overall transfer level: Needs assistance Equipment used: Rolling walker (2 wheels) Transfers: Sit to/from Stand Sit to Stand: Mod assist           General transfer comment: mod initially then less, down to min assist    Ambulation/Gait Ambulation/Gait assistance: Min guard Gait Distance (Feet): 150 Feet Assistive device: Rolling walker (2 wheels);1 person hand held assist Gait Pattern/deviations: Step-through pattern;Step-to pattern;Decreased stride length;Decreased weight shift to right Gait velocity: reduced Gait velocity interpretation: <1.31 ft/sec, indicative of household ambulator Pre-gait activities: sidesteps and postural correction General Gait Details: encouraged him to shorten R LE step to avoid the step to pattern, requires repetitive reminders   Stairs Stairs: Yes Stairs assistance: Min guard Stair Management: One rail Right;Step to pattern;Forwards Number of Stairs: 3 General stair comments: pt was assisted to step up with LLE and step down on RLE   Wheelchair Mobility    Modified Rankin (Stroke Patients Only)       Balance Overall balance assessment: Needs assistance Sitting-balance support: Feet supported;Single extremity supported Sitting balance-Leahy Scale: Good Sitting balance - Comments: reminders not to scoot to EOB with slick shorts Postural control: Posterior lean Standing balance support: Bilateral upper extremity supported;During functional activity Standing balance-Leahy Scale: Fair Standing balance comment: fair with walker to steady himself  Cognition Arousal/Alertness: Awake/alert Behavior During Therapy: WFL for tasks assessed/performed Overall Cognitive Status: Within Functional Limits for tasks assessed                                           Exercises Total Joint Exercises Ankle Circles/Pumps: AROM;5 reps Long Arc Quad: AROM Knee Flexion: AROM;10 reps Goniometric ROM: 65    General Comments General comments (skin integrity, edema, etc.): Pt is up to walk and climb stair and then instructed on how to stretch R knee at side of bed, to flex and hold for better ROM.  Flexed to 65 deg      Pertinent Vitals/Pain Pain Assessment: 0-10 Pain Score: 6  Pain Location: R knee with wbing Pain Descriptors / Indicators: Guarding;Grimacing Pain Intervention(s): Monitored during session;Repositioned;Ice applied    Home Living                          Prior Function            PT Goals (current goals can now be found in the care plan section) Acute Rehab PT Goals Patient Stated Goal: walk and go home today Progress towards PT goals: Progressing toward goals    Frequency    7X/week      PT Plan Current plan remains appropriate    Co-evaluation              AM-PAC PT "6 Clicks" Mobility   Outcome Measure  Help needed turning from your back to your side while in a flat bed without using bedrails?: A Little Help needed moving from lying on your back to sitting on the side of a flat bed without using bedrails?: A Little Help needed moving to and from a bed to a chair (including a wheelchair)?: A Little Help needed standing up from a chair using your arms (e.g., wheelchair or bedside chair)?: A Little Help needed to walk in hospital room?: A Little Help needed climbing 3-5 steps with a railing? : A Little 6 Click Score: 18    End of Session Equipment Utilized During Treatment: Gait belt Activity Tolerance: Patient tolerated treatment well;Patient limited by fatigue;Patient limited by pain Patient left: with call bell/phone within reach;in bed;with bed alarm set Nurse Communication: Mobility status;Weight bearing status PT Visit Diagnosis:  Unsteadiness on feet (R26.81);Muscle weakness (generalized) (M62.81);Pain Pain - Right/Left: Right Pain - part of body: Knee     Time: 1132-1201 PT Time Calculation (min) (ACUTE ONLY): 29 min  Charges:  $Gait Training: 8-22 mins $Therapeutic Exercise: 8-22 mins     Ivar Drape 11/24/2021, 5:01 PM Samul Dada, PT PhD Acute Rehab Dept. Number: [facility] [id number] and [facility] [phone]

## 2021-11-24 NOTE — Telephone Encounter (Signed)
Received an email from [employer] that PA has been started for Oxycodone 7.5/325.  Prior auth submitted and awaiting approval.   Status Sent to [employer] oxyCODONE-Acetaminophen 7.5-325MG  tablets Form Photographer PA Form (2017 NCPDP) Original Claim Info 75 SUBMIT PA REQUEST [email or web address]; AVOID NEW OPIOID [id number] REQUIRES PRIOR AUTHORIZATION

## 2021-11-24 NOTE — Progress Notes (Signed)
Discharge instruction given to the patient. Questions and concerns answered. Dressing to the surgical site changed per order. VS checked and IV discontinued.

## 2021-11-24 NOTE — Plan of Care (Signed)
°  Problem: Education: Goal: Knowledge of General Education information will improve Description: Including pain rating scale, medication(s)/side effects and non-pharmacologic comfort measures 11/24/2021 1841 by Concepcion Living, RN Outcome: Adequate for Discharge 11/24/2021 1636 by Concepcion Living, RN Outcome: Progressing   Problem: Health Behavior/Discharge Planning: Goal: Ability to manage health-related needs will improve 11/24/2021 1841 by Concepcion Living, RN Outcome: Adequate for Discharge 11/24/2021 1636 by Concepcion Living, RN Outcome: Progressing   Problem: Clinical Measurements: Goal: Ability to maintain clinical measurements within normal limits will improve 11/24/2021 1841 by Concepcion Living, RN Outcome: Adequate for Discharge 11/24/2021 1636 by Concepcion Living, RN Outcome: Progressing Goal: Will remain free from infection Outcome: Adequate for Discharge Goal: Diagnostic test results will improve Outcome: Adequate for Discharge Goal: Respiratory complications will improve Outcome: Adequate for Discharge Goal: Cardiovascular complication will be avoided Outcome: Adequate for Discharge   Problem: Activity: Goal: Risk for activity intolerance will decrease Outcome: Adequate for Discharge   Problem: Nutrition: Goal: Adequate nutrition will be maintained Outcome: Adequate for Discharge   Problem: Coping: Goal: Level of anxiety will decrease Outcome: Adequate for Discharge   Problem: Elimination: Goal: Will not experience complications related to bowel motility Outcome: Adequate for Discharge Goal: Will not experience complications related to urinary retention Outcome: Adequate for Discharge   Problem: Pain Managment: Goal: General experience of comfort will improve Outcome: Adequate for Discharge   Problem: Safety: Goal: Ability to remain free from injury will improve Outcome: Adequate for Discharge   Problem: Skin Integrity: Goal: Risk for impaired  skin integrity will decrease Outcome: Adequate for Discharge

## 2021-11-26 ENCOUNTER — Telehealth: Payer: Self-pay | Admitting: *Deleted

## 2021-11-26 NOTE — Telephone Encounter (Signed)
Spoke to patient and he states he's doing pretty well overall at home. Discussed ice, swelling, use of muscle relaxer and states he's not taking much of the Percocet. We discussed something a little less strong for him and I told him I'd ask. Working on getting him set up with HHPT or OPPT. Explained no agencies were open over the weekend after multiple attempts and CM  would re-attempt to secure either on Monday morning. He was agreeable to starting OPPT and would prefer this in [place] next door to Dr. Ophelia Charter office. Would make this arrangement and notify him. Thanks.

## 2021-11-30 ENCOUNTER — Ambulatory Visit (INDEPENDENT_AMBULATORY_CARE_PROVIDER_SITE_OTHER): Payer: BC Managed Care – PPO

## 2021-11-30 ENCOUNTER — Ambulatory Visit (INDEPENDENT_AMBULATORY_CARE_PROVIDER_SITE_OTHER): Payer: BC Managed Care – PPO | Admitting: Orthopaedic Surgery

## 2021-11-30 ENCOUNTER — Encounter: Payer: Self-pay | Admitting: Orthopaedic Surgery

## 2021-11-30 ENCOUNTER — Other Ambulatory Visit: Payer: Self-pay

## 2021-11-30 VITALS — Ht 74.0 in | Wt 225.0 lb

## 2021-11-30 DIAGNOSIS — Z96651 Presence of right artificial knee joint: Secondary | ICD-10-CM

## 2021-11-30 MED ORDER — OXYCODONE-ACETAMINOPHEN 7.5-325 MG PO TABS: 1.0000 | ORAL_TABLET | Freq: Four times a day (QID) | ORAL | 0 refills | Status: AC | PRN

## 2021-11-30 NOTE — Progress Notes (Deleted)
Post-Op Visit Note   Patient: Brian Maddox           Date of Birth: 1971/01/23           MRN: [id number] Visit Date: 11/30/2021 PCP: Estanislado Pandy, MD   Assessment & Plan: Follow-up total knee arthroplasty.  He started outpatient therapy Percocet renewed.  Recheck 1 week for likely staple removal.  Chief Complaint:  Chief Complaint  Patient presents with   Right Knee - Routine Post Op    11/22/2021 Right TKA   Visit Diagnoses:  1. Status post total knee replacement, right     Plan: ROV one week  Follow-Up Instructions: No follow-ups on file.   Orders:  Orders Placed This Encounter  Procedures   XR Knee 1-2 Views Right   Meds ordered this encounter  Medications   oxyCODONE-acetaminophen (PERCOCET) 7.5-325 MG tablet    Sig: Take 1 tablet by mouth every 6 (six) hours as needed for severe pain.    Dispense:  50 tablet    Refill:  0    Post op pain total knee    Imaging: XR Knee 1-2 Views Right  Result Date: 11/30/2021 Standing [facility] x-rays lateral x-ray obtained and reviewed.  This shows total knee arthroplasty on the right satisfactory position alignment.  Previous distal femoral ACL screw still in unchanged position. Impression: Satisfactory right total knee arthroplasty.   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   Recurrent Diverticulitis s/p robotic sigmoid colectomy 08/07/2017 05/02/2017   Past Medical History:  Diagnosis Date   Complication of anesthesia    Difficult urinating after procedure   Diverticulitis of colon 05/02/2017    Family History  Problem Relation Age of Onset   Non-Hodgkin's lymphoma Mother    Sarcoidosis Father    Coronary artery disease Father     Past Surgical History:  Procedure Laterality Date   COLONOSCOPY N/A 05/22/2017   Procedure:  COLONOSCOPY;  Surgeon: Malissa Hippo, MD;  [facility];  Service: Endoscopy;  Laterality: N/A;  2:30   HARDWARE REMOVAL Right 11/22/2021   Procedure: HARDWARE REMOVAL;  Surgeon: Eldred Manges, MD;  Location: [facility] OR;  Service: Orthopedics;  Laterality: Right;   HERNIA REPAIR     age 62 months   IR FIBRIN GLUE REPAIR ANAL FISTULA     last year   kidney reflux surgery     40 months old   NASAL FRACTURE SURGERY     fx nose   PROCTOSCOPY N/A 08/07/2017   Procedure: RIGID PROCTOSCOPY;  Surgeon: Karie Soda, MD;  Location: [facility];  Service: General;  Laterality: N/A;   three knee sugeries     two ACL/MCL repair. 3rd was scoped   TOTAL KNEE ARTHROPLASTY Right 11/22/2021   Procedure: RIGHT TOTAL KNEE ARTHROPLASTY,  REMOVAL OLD ANTERIOR CRUCIATE LIGAMENT SCREWS;  Surgeon: Eldred Manges, MD;  Location: [facility] OR;  Service: Orthopedics;  Laterality: Right;   Social History   Occupational History   Not on file  Tobacco Use   Smoking status: Never   Smokeless tobacco: Current    Types: Snuff  Vaping Use   Vaping Use: Never used  Substance and Sexual Activity   Alcohol use: Yes    Comment: rarely   Drug use: No   Sexual activity:  Not on file      Post-Op Visit Note   Patient: Brian Maddox           Date of Birth: 07-Jul-1971           MRN: [id number] Visit Date: 11/30/2021 PCP: Manon Hilding, MD   Assessment & Plan:  Chief Complaint:  Chief Complaint  Patient presents with   Right Knee - Routine Post Op    11/22/2021 Right TKA   Visit Diagnoses:  1. Status post total knee replacement, right     Plan: ***  Follow-Up Instructions: No follow-ups on file.   Orders:  Orders Placed This Encounter  Procedures   XR Knee 1-2 Views Right   No orders of the defined types were placed in this encounter.   Imaging: No results found.  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   Recurrent Diverticulitis s/p robotic sigmoid colectomy 08/07/2017 05/02/2017   Past Medical History:  Diagnosis Date   Complication of anesthesia    Difficult urinating after procedure   Diverticulitis of colon 05/02/2017    Family History  Problem Relation Age of Onset   Non-Hodgkin's lymphoma Mother    Sarcoidosis Father    Coronary artery disease Father     Past Surgical History:  Procedure Laterality Date   COLONOSCOPY N/A 05/22/2017   Procedure: COLONOSCOPY;  Surgeon: Rogene Houston, MD;  [facility];  Service: Endoscopy;  Laterality: N/A;  2:30   HARDWARE REMOVAL Right 11/22/2021   Procedure: HARDWARE REMOVAL;  Surgeon: Marybelle Killings, MD;  Location: [facility];  Service: Orthopedics;  Laterality: Right;   HERNIA REPAIR     age 15 months   IR FIBRIN GLUE REPAIR ANAL FISTULA     last year   kidney reflux surgery     8 months old   NASAL FRACTURE SURGERY     fx nose   PROCTOSCOPY N/A 08/07/2017   Procedure: RIGID PROCTOSCOPY;  Surgeon: Michael Boston, MD;  Location: [facility];  Service: General;  Laterality: N/A;   three knee sugeries     two ACL/MCL repair. 3rd was scoped   TOTAL KNEE ARTHROPLASTY Right 11/22/2021   Procedure: RIGHT TOTAL KNEE ARTHROPLASTY,  REMOVAL OLD ANTERIOR CRUCIATE LIGAMENT SCREWS;  Surgeon: Marybelle Killings, MD;  Location: [place];  Service: Orthopedics;  Laterality: Right;   Social History   Occupational History   Not on file  Tobacco Use   Smoking status: Never   Smokeless tobacco: Current    Types: Snuff  Vaping Use   Vaping Use: Never used  Substance and Sexual Activity   Alcohol use: Yes    Comment: rarely   Drug use: No   Sexual activity: Not on file

## 2021-11-30 NOTE — Discharge Summary (Signed)
Patient ID: JASH WAHLEN MRN: [id number] DOB/AGE: [birth year] 52 y.o.  Admit date: 11/22/2021 Discharge date: 11/24/2021  Admission Diagnoses:  Principal Problem:   Right knee DJD Active Problems:   S/P TKR (total knee replacement), right   Discharge Diagnoses:  Principal Problem:   Right knee DJD Active Problems:   S/P TKR (total knee replacement), right  status post Procedure(s): RIGHT TOTAL KNEE ARTHROPLASTY,  REMOVAL OLD ANTERIOR CRUCIATE LIGAMENT SCREWS HARDWARE REMOVAL  Past Medical History:  Diagnosis Date   Complication of anesthesia    Difficult urinating after procedure   Diverticulitis of colon 05/02/2017    Surgeries: Procedure(s): RIGHT TOTAL KNEE ARTHROPLASTY,  REMOVAL OLD ANTERIOR CRUCIATE LIGAMENT SCREWS HARDWARE REMOVAL on 11/22/2021   Consultants:   Discharged Condition: Improved  Hospital Course: KANYE DEPREE is an 50 y.o. male who was admitted 11/22/2021 for operative treatment of Right knee DJD. Patient failed conservative treatments (please see the history and physical for the specifics) and had severe unremitting pain that affects sleep, daily activities and work/hobbies. After pre-op clearance, the patient was taken to the operating room on 11/22/2021 and underwent  Procedure(s): RIGHT TOTAL KNEE ARTHROPLASTY,  REMOVAL OLD ANTERIOR CRUCIATE LIGAMENT SCREWS HARDWARE REMOVAL.    Patient was given perioperative antibiotics:  Anti-infectives (From admission, onward)    Start     Dose/Rate Route Frequency Ordered Stop   11/22/21 0715  ceFAZolin (ANCEF) IVPB 2g/100 mL premix        2 g 200 mL/hr over 30 Minutes Intravenous On call to O.R. 11/22/21 0707 11/22/21 1254        Patient was given sequential compression devices and early ambulation to prevent DVT.   Patient benefited maximally from hospital stay and there were no complications. At the time of discharge, the patient was urinating/moving their bowels without difficulty,  tolerating a regular diet, pain is controlled with oral pain medications and they have been cleared by PT/OT.   Recent vital signs: No data found.   Recent laboratory studies: No results for input(s): WBC, HGB, HCT, PLT, NA, K, CL, CO2, BUN, CREATININE, GLUCOSE, INR, CALCIUM in the last 72 hours.  Invalid input(s): PT, 2   Discharge Medications:   Allergies as of 11/24/2021   No Known Allergies      Medication List     STOP taking these medications    ibuprofen 200 MG tablet Commonly known as: ADVIL   traMADol 50 MG tablet Commonly known as: ULTRAM       TAKE these medications    aspirin 325 MG EC tablet Take 1 tablet (325 mg total) by mouth daily with breakfast.   methocarbamol 500 MG tablet Commonly known as: ROBAXIN Take 1 tablet (500 mg total) by mouth every 6 (six) hours as needed for muscle spasms.   tamsulosin 0.4 MG Caps capsule Commonly known as: FLOMAX Take 1 capsule (0.4 mg total) by mouth daily.        Diagnostic Studies: XR Knee 1-2 Views Right  Result Date: 11/30/2021 Standing [facility] x-rays lateral x-ray obtained and reviewed.  This shows total knee arthroplasty on the right satisfactory position alignment.  Previous distal femoral ACL screw still in unchanged position. Impression: Satisfactory right total knee arthroplasty.      Follow-up Information     Eldred Manges, MD Follow up in 1 week(s).   Specialty: Orthopedic Surgery Why: NEED RETURN OFFICE WITH DR Ophelia Charter ONE WEEK POSTOP.  CALL FOR APPOINTMENT. Contact information: [address] [place] [address]  [phone]         Estanislado Pandy, MD Follow up.   Specialty: Family Medicine Contact information: [address] [place] [address] [phone]                 Discharge Plan:  discharge to home  Disposition:     Signed: Zonia Kief  11/30/2021, 11:44 AM

## 2021-11-30 NOTE — Progress Notes (Signed)
° °  Post-Op Visit Note   Patient: Brian Maddox           Date of Birth: 1971/11/06           MRN: [id number] Visit Date: 11/30/2021 PCP: Estanislado Pandy, MD   Assessment & Plan:ROV one week  Chief Complaint:  Chief Complaint  Patient presents with   Right Knee - Routine Post Op    11/22/2021 Right TKA   Visit Diagnoses:  1. Status post total knee replacement, right     Plan:ROV one week for staple removal  Follow-Up Instructions: Return in about 1 week (around 12/07/2021).   Orders:  Orders Placed This Encounter  Procedures   XR Knee 1-2 Views Right   Meds ordered this encounter  Medications   oxyCODONE-acetaminophen (PERCOCET) 7.5-325 MG tablet    Sig: Take 1 tablet by mouth every 6 (six) hours as needed for severe pain.    Dispense:  50 tablet    Refill:  0    Post op pain total knee    Imaging: XR Knee 1-2 Views Right  Result Date: 11/30/2021 Standing [facility] x-rays lateral x-ray obtained and reviewed.  This shows total knee arthroplasty on the right satisfactory position alignment.  Previous distal femoral ACL screw still in unchanged position. Impression: Satisfactory right total knee arthroplasty.   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   Recurrent Diverticulitis s/p robotic sigmoid colectomy 08/07/2017 05/02/2017   Past Medical History:  Diagnosis Date   Complication of anesthesia    Difficult urinating after procedure   Diverticulitis of colon 05/02/2017    Family History  Problem Relation Age of Onset   Non-Hodgkin's lymphoma Mother    Sarcoidosis Father    Coronary artery disease Father     Past Surgical History:  Procedure Laterality Date   COLONOSCOPY N/A 05/22/2017   Procedure: COLONOSCOPY;  Surgeon: Malissa Hippo, MD;  [facility];   Service: Endoscopy;  Laterality: N/A;  2:30   HARDWARE REMOVAL Right 11/22/2021   Procedure: HARDWARE REMOVAL;  Surgeon: Eldred Manges, MD;  Location: [facility] OR;  Service: Orthopedics;  Laterality: Right;   HERNIA REPAIR     age 76 months   IR FIBRIN GLUE REPAIR ANAL FISTULA     last year   kidney reflux surgery     17 months old   NASAL FRACTURE SURGERY     fx nose   PROCTOSCOPY N/A 08/07/2017   Procedure: RIGID PROCTOSCOPY;  Surgeon: Karie Soda, MD;  Location: [facility];  Service: General;  Laterality: N/A;   three knee sugeries     two ACL/MCL repair. 3rd was scoped   TOTAL KNEE ARTHROPLASTY Right 11/22/2021   Procedure: RIGHT TOTAL KNEE ARTHROPLASTY,  REMOVAL OLD ANTERIOR CRUCIATE LIGAMENT SCREWS;  Surgeon: Eldred Manges, MD;  Location: [facility] OR;  Service: Orthopedics;  Laterality: Right;   Social History   Occupational History   Not on file  Tobacco Use   Smoking status: Never   Smokeless tobacco: Current    Types: Snuff  Vaping Use   Vaping Use: Never used  Substance and Sexual Activity   Alcohol use: Yes    Comment: rarely   Drug use: No   Sexual activity: Not on file

## 2021-12-07 ENCOUNTER — Ambulatory Visit (INDEPENDENT_AMBULATORY_CARE_PROVIDER_SITE_OTHER): Payer: BC Managed Care – PPO | Admitting: Orthopaedic Surgery

## 2021-12-07 ENCOUNTER — Encounter: Payer: Self-pay | Admitting: Orthopaedic Surgery

## 2021-12-07 ENCOUNTER — Other Ambulatory Visit: Payer: Self-pay

## 2021-12-07 VITALS — Ht 73.0 in | Wt 220.0 lb

## 2021-12-07 DIAGNOSIS — Z96651 Presence of right artificial knee joint: Secondary | ICD-10-CM

## 2021-12-07 NOTE — Progress Notes (Signed)
° °  Post-Op Visit Note   Patient: Brian Maddox           Date of Birth: Nov 28, 1971           MRN: [id number] Visit Date: 12/07/2021 PCP: Estanislado Pandy, MD   Assessment & Plan: Postop right total knee arthroplasty after old ACL reconstruction many years ago with only 30 degrees preop knee flexion.  He is now at 66 still working hard.  He reaches near full extension quad strength is improving and he is continue to work hard on his flexion which is going to be his biggest problem.  Recheck 3 weeks.  Chief Complaint:  Chief Complaint  Patient presents with   Right Knee - Follow-up    11/22/2021 Right TKA, removal old ACL screws   Visit Diagnoses:  1. S/P TKR (total knee replacement), right     Plan: Return 3 weeks check his motion progress.  Follow-Up Instructions: No follow-ups on file.   Orders:  No orders of the defined types were placed in this encounter.  No orders of the defined types were placed in this encounter.   Imaging: No results found.  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   Recurrent Diverticulitis s/p robotic sigmoid colectomy 08/07/2017 05/02/2017   Past Medical History:  Diagnosis Date   Complication of anesthesia    Difficult urinating after procedure   Diverticulitis of colon 05/02/2017    Family History  Problem Relation Age of Onset   Non-Hodgkin's lymphoma Mother    Sarcoidosis Father    Coronary artery disease Father     Past Surgical History:  Procedure Laterality Date   COLONOSCOPY N/A 05/22/2017   Procedure: COLONOSCOPY;  Surgeon: Malissa Hippo, MD;  Location: [facility];  Service: Endoscopy;  Laterality: N/A;  2:30   HARDWARE REMOVAL Right 11/22/2021   Procedure: HARDWARE REMOVAL;  Surgeon: Eldred Manges, MD;  Location: [facility] OR;  Service:  Orthopedics;  Laterality: Right;   HERNIA REPAIR     age 35 months   IR FIBRIN GLUE REPAIR ANAL FISTULA     last year   kidney reflux surgery     63 months old   NASAL FRACTURE SURGERY     fx nose   PROCTOSCOPY N/A 08/07/2017   Procedure: RIGID PROCTOSCOPY;  Surgeon: Karie Soda, MD;  Location: [facility];  Service: General;  Laterality: N/A;   three knee sugeries     two ACL/MCL repair. 3rd was scoped   TOTAL KNEE ARTHROPLASTY Right 11/22/2021   Procedure: RIGHT TOTAL KNEE ARTHROPLASTY,  REMOVAL OLD ANTERIOR CRUCIATE LIGAMENT SCREWS;  Surgeon: Eldred Manges, MD;  Location: [facility];  Service: Orthopedics;  Laterality: Right;   Social History   Occupational History   Not on file  Tobacco Use   Smoking status: Never   Smokeless tobacco: Current    Types: Snuff  Vaping Use   Vaping Use: Never used  Substance and Sexual Activity   Alcohol use: Yes    Comment: rarely   Drug use: No   Sexual activity: Not on file

## 2021-12-21 ENCOUNTER — Other Ambulatory Visit: Payer: Self-pay | Admitting: Surgery

## 2021-12-28 ENCOUNTER — Ambulatory Visit (INDEPENDENT_AMBULATORY_CARE_PROVIDER_SITE_OTHER): Payer: BC Managed Care – PPO | Admitting: Orthopaedic Surgery

## 2021-12-28 ENCOUNTER — Other Ambulatory Visit: Payer: Self-pay

## 2021-12-28 ENCOUNTER — Encounter: Payer: Self-pay | Admitting: Orthopaedic Surgery

## 2021-12-28 VITALS — Ht 74.0 in | Wt 225.0 lb

## 2021-12-28 DIAGNOSIS — Z96651 Presence of right artificial knee joint: Secondary | ICD-10-CM

## 2021-12-28 NOTE — Progress Notes (Signed)
° °  Post-Op Visit Note   Patient: Brian Maddox           Date of Birth: Apr 01, 1971           MRN: [id number] Visit Date: 12/28/2021 PCP: Estanislado Pandy, MD   Assessment & Plan: Post right total knee arthroplasty after old ACL reconstruction 35 years ago.  Preop he only had 3035 degrees flexion now he is at 87 degrees flexion.  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  Continue to work on flexion strength is good and he has good extension.  Recheck 8 weeks.  Chief Complaint:  Chief Complaint  Patient presents with   Right Knee - Follow-up    11/22/2021 Right TKA , removal of old ACL screws   Visit Diagnoses:  1. S/P TKR (total knee replacement), right     Plan: Return 8 weeks.  Follow-Up Instructions: Return in about 8 weeks (around 02/22/2022).   Orders:  No orders of the defined types were placed in this encounter.  No orders of the defined types were placed in this encounter.   Imaging: No results found.  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   Recurrent Diverticulitis s/p robotic sigmoid colectomy 08/07/2017 05/02/2017   Past Medical History:  Diagnosis Date   Complication of anesthesia    Difficult urinating after procedure   Diverticulitis of colon 05/02/2017    Family History  Problem Relation Age of Onset   Non-Hodgkin's lymphoma Mother    Sarcoidosis Father    Coronary artery disease Father     Past Surgical History:  Procedure Laterality Date   COLONOSCOPY N/A 05/22/2017   Procedure: COLONOSCOPY;  Surgeon: Malissa Hippo, MD;  [facility]: [facility];  Service: Endoscopy;  Laterality: N/A;  2:30   HARDWARE REMOVAL Right 11/22/2021   Procedure: HARDWARE REMOVAL;  Surgeon: Eldred Manges, MD;  Location: [facility];  Service: Orthopedics;  Laterality: Right;   HERNIA REPAIR     age 20 months   IR FIBRIN GLUE REPAIR ANAL  FISTULA     last year   kidney reflux surgery     58 months old   NASAL FRACTURE SURGERY     fx nose   PROCTOSCOPY N/A 08/07/2017   Procedure: RIGID PROCTOSCOPY;  Surgeon: Karie Soda, MD;  Location: [facility];  Service: General;  Laterality: N/A;   three knee sugeries     two ACL/MCL repair. 3rd was scoped   TOTAL KNEE ARTHROPLASTY Right 11/22/2021   Procedure: RIGHT TOTAL KNEE ARTHROPLASTY,  REMOVAL OLD ANTERIOR CRUCIATE LIGAMENT SCREWS;  Surgeon: Eldred Manges, MD;  Location: [facility];  Service: Orthopedics;  Laterality: Right;   Social History   Occupational History   Not on file  Tobacco Use   Smoking status: Never   Smokeless tobacco: Current    Types: Snuff  Vaping Use   Vaping Use: Never used  Substance and Sexual Activity   Alcohol use: Yes    Comment: rarely   Drug use: No   Sexual activity: Not on file

## 2022-03-01 ENCOUNTER — Other Ambulatory Visit: Payer: Self-pay

## 2022-03-01 ENCOUNTER — Encounter: Payer: BC Managed Care – PPO | Admitting: Orthopaedic Surgery

## 2022-04-05 ENCOUNTER — Ambulatory Visit (INDEPENDENT_AMBULATORY_CARE_PROVIDER_SITE_OTHER): Payer: BC Managed Care – PPO

## 2022-04-05 ENCOUNTER — Ambulatory Visit (INDEPENDENT_AMBULATORY_CARE_PROVIDER_SITE_OTHER): Payer: BC Managed Care – PPO | Admitting: Orthopaedic Surgery

## 2022-04-05 ENCOUNTER — Encounter: Payer: Self-pay | Admitting: Orthopaedic Surgery

## 2022-04-05 DIAGNOSIS — Z96651 Presence of right artificial knee joint: Secondary | ICD-10-CM

## 2022-04-05 NOTE — Progress Notes (Signed)
? ?Office Visit Note ?  ?Patient: Brian Maddox           ?Date of Birth: [birth year]           ?MRN: [id number] ?Visit Date: 04/05/2022 ?             ?Requested by: Sasser, Silvestre Moment, MD ?[id number] ?[place],  [place] [address] ?PCP: Manon Hilding, MD ? ? ?Assessment & Plan: ?Visit Diagnoses:  ?1. S/P TKR (total knee replacement), right   ? ? ?Plan: Patient will get back working on range of motion try to get another 5 to 10 degrees of flexion and get out to full extension which help when he standing.  Continue quad work which will help with his constant knee discomfort.  Most of his discomfort is lateral over the femoral condyle. ? ?Follow-Up Instructions: No follow-ups on file.  ? ?Orders:  ?Orders Placed This Encounter  ?Procedures  ? XR Knee 1-2 Views Right  ? ?No orders of the defined types were placed in this encounter. ? ? ? ? Procedures: ?No procedures performed ? ? ?Clinical Data: ?No additional findings. ? ? ?Subjective: ?Chief Complaint  ?Patient presents with  ? Right Knee - Routine Post Op  ?  Removal of hardware tibial interference screw, right total knee arthroplasty. ?DOS 11/30/2021  ? ? ?HPI post right total knee arthroplasty for OA post old ACL reconstruction.  He is back at work states his knee is sore tight he is flexing to about 80 had about 85 to 87 degrees max at 1 point doing therapy.  He comes within about 5 degrees of full extension but does not have a hard stop and has a little bit of balance suggesting he could get a little bit more extension with work.  He did stop going to the gym with work activity but thinks after school season finishes he can get back.  Opposite knee is bothering him some and he reports that he had an on-the-job injury to his left knee sometime back. ? ?Review of Systems updated unchanged ? ? ?Objective: ?Vital Signs: There were no vitals taken for this visit. ? ?Physical Exam ?Constitutional:   ?   Appearance: He is well-developed.  ?HENT:  ?   Head: Normocephalic and  atraumatic.  ?   Right Ear: External ear normal.  ?   Left Ear: External ear normal.  ?Eyes:  ?   Pupils: Pupils are equal, round, and reactive to light.  ?Neck:  ?   Thyroid: No thyromegaly.  ?   Trachea: No tracheal deviation.  ?Cardiovascular:  ?   Rate and Rhythm: Normal rate.  ?Pulmonary:  ?   Effort: Pulmonary effort is normal.  ?   Breath sounds: No wheezing.  ?Abdominal:  ?   General: Bowel sounds are normal.  ?   Palpations: Abdomen is soft.  ?Musculoskeletal:  ?   Cervical back: Neck supple.  ?Skin: ?   General: Skin is warm and dry.  ?   Capillary Refill: Capillary refill takes less than 2 seconds.  ?Neurological:  ?   Mental Status: He is alert and oriented to person, place, and time.  ?Psychiatric:     ?   Behavior: Behavior normal.     ?   Thought Content: Thought content normal.     ?   Judgment: Judgment normal.  ? ? ?Ortho Exam well-healed right incision slight warmth of the incision trace effusion.  5-75 degrees range of motion  today.  Collateral ligaments are stable good patellar tracking. ? ?Specialty Comments:  ?No specialty comments available. ? ?Imaging: ?No results found. ? ? ?PMFS History: ?Patient Active Problem List  ? Diagnosis Date Noted  ? S/P TKR (total knee replacement), right 11/23/2021  ? 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 Recurrent Diverticulitis s/p robotic sigmoid colectomy 08/07/2017 05/02/2017  ? ?Past Medical History:  ?Diagnosis Date  ? Complication of anesthesia   ? Difficult urinating after procedure  ? Diverticulitis of colon 05/02/2017  ?  ?Family History  ?Problem Relation Age of Onset  ? Non-Hodgkin's lymphoma Mother   ? Sarcoidosis Father   ? Coronary artery disease Father   ?  ?Past Surgical History:  ?Procedure Laterality Date  ? COLONOSCOPY N/A 05/22/2017  ? Procedure: COLONOSCOPY;  Surgeon: Rogene Houston, MD;  Location: [facility];  Service: Endoscopy;  Laterality: N/A;  2:30  ? HARDWARE REMOVAL Right 11/22/2021  ? Procedure: HARDWARE REMOVAL;  Surgeon: Marybelle Killings, MD;  Location: [facility];  Service: Orthopedics;  Laterality: Right;  ? HERNIA REPAIR    ? age 51 years  ? IR FIBRIN GLUE REPAIR ANAL FISTULA    ? 51 years  ? kidney reflux surgery    ? 51 years old  ? NASAL FRACTURE SURGERY    ? fx nose  ? PROCTOSCOPY N/A 08/07/2017  ? Procedure: RIGID PROCTOSCOPY;  Surgeon: Michael Boston, MD;  Location: [facility];  Service: General;  Laterality: N/A;  ? three knee sugeries    ? two ACL/MCL repair. 3rd was scoped  ? TOTAL KNEE ARTHROPLASTY Right 11/22/2021  ? Procedure: RIGHT TOTAL KNEE ARTHROPLASTY,  REMOVAL OLD ANTERIOR CRUCIATE LIGAMENT SCREWS;  Surgeon: Marybelle Killings, MD;  Location: [facility];  Service: Orthopedics;  Laterality: Right;  ? ?Social History  ? ?Occupational History  ? Not on file  ?Tobacco Use  ? Smoking status: Never  ? Smokeless tobacco: Current  ?  Types: Snuff  ?Vaping Use  ? Vaping Use: Never used  ?Substance and Sexual Activity  ? Alcohol use: Yes  ?  Comment: rarely  ? Drug use: No  ? Sexual activity: Not on file  ? ? ? ? ? ? ?
